# Patient Record
Sex: Male | Born: 1935 | Race: Black or African American | Hispanic: No | Smoking: Former smoker
Health system: Southern US, Community
[De-identification: ages and names within clinical notes are randomized; demographics above are authoritative.]

## PROBLEM LIST (undated history)

## (undated) DIAGNOSIS — H4089 Other specified glaucoma: Secondary | ICD-10-CM

## (undated) DIAGNOSIS — C649 Malignant neoplasm of unspecified kidney, except renal pelvis: Secondary | ICD-10-CM

## (undated) DIAGNOSIS — F329 Major depressive disorder, single episode, unspecified: Secondary | ICD-10-CM

## (undated) DIAGNOSIS — I10 Essential (primary) hypertension: Secondary | ICD-10-CM

## (undated) DIAGNOSIS — Z8546 Personal history of malignant neoplasm of prostate: Secondary | ICD-10-CM

## (undated) DIAGNOSIS — F32A Depression, unspecified: Secondary | ICD-10-CM

## (undated) HISTORY — DX: Depression, unspecified: F32.A

## (undated) HISTORY — DX: Major depressive disorder, single episode, unspecified: F32.9

## (undated) HISTORY — DX: Essential (primary) hypertension: I10

## (undated) HISTORY — DX: Malignant neoplasm of unspecified kidney, except renal pelvis: C64.9

## (undated) HISTORY — DX: Personal history of malignant neoplasm of prostate: Z85.46

## (undated) HISTORY — PX: ELBOW FRACTURE SURGERY: SHX616

## (undated) HISTORY — DX: Other specified glaucoma: H40.89

---

## 2002-09-02 ENCOUNTER — Ambulatory Visit: Admission: RE | Admit: 2002-09-02 | Discharge: 2002-10-23 | Payer: Self-pay | Admitting: Radiation Oncology

## 2002-11-09 ENCOUNTER — Ambulatory Visit: Admission: RE | Admit: 2002-11-09 | Discharge: 2003-02-07 | Payer: Self-pay | Admitting: Radiation Oncology

## 2003-02-22 ENCOUNTER — Ambulatory Visit: Admission: RE | Admit: 2003-02-22 | Discharge: 2003-02-22 | Payer: Self-pay | Admitting: Radiation Oncology

## 2003-04-13 ENCOUNTER — Ambulatory Visit (HOSPITAL_COMMUNITY): Admission: RE | Admit: 2003-04-13 | Discharge: 2003-04-13 | Payer: Self-pay | Admitting: Ophthalmology

## 2003-08-23 ENCOUNTER — Ambulatory Visit: Admission: RE | Admit: 2003-08-23 | Discharge: 2003-08-23 | Payer: Self-pay | Admitting: Radiation Oncology

## 2003-08-31 ENCOUNTER — Ambulatory Visit: Admission: RE | Admit: 2003-08-31 | Discharge: 2003-08-31 | Payer: Self-pay | Admitting: Radiation Oncology

## 2003-12-17 ENCOUNTER — Ambulatory Visit: Payer: Self-pay | Admitting: Psychology

## 2003-12-27 ENCOUNTER — Ambulatory Visit: Payer: Self-pay | Admitting: Internal Medicine

## 2004-01-14 ENCOUNTER — Ambulatory Visit: Payer: Self-pay | Admitting: Psychology

## 2004-08-01 ENCOUNTER — Ambulatory Visit (HOSPITAL_COMMUNITY): Admission: RE | Admit: 2004-08-01 | Discharge: 2004-08-01 | Payer: Self-pay | Admitting: Ophthalmology

## 2004-09-04 ENCOUNTER — Ambulatory Visit: Payer: Self-pay | Admitting: Internal Medicine

## 2004-11-30 ENCOUNTER — Ambulatory Visit: Payer: Self-pay | Admitting: Internal Medicine

## 2005-05-22 ENCOUNTER — Ambulatory Visit: Payer: Self-pay | Admitting: Internal Medicine

## 2005-11-16 ENCOUNTER — Ambulatory Visit: Payer: Self-pay | Admitting: Internal Medicine

## 2006-11-11 ENCOUNTER — Encounter: Payer: Self-pay | Admitting: Internal Medicine

## 2006-11-11 ENCOUNTER — Ambulatory Visit: Payer: Self-pay | Admitting: Internal Medicine

## 2006-11-11 DIAGNOSIS — I10 Essential (primary) hypertension: Secondary | ICD-10-CM | POA: Insufficient documentation

## 2006-11-11 DIAGNOSIS — H4089 Other specified glaucoma: Secondary | ICD-10-CM | POA: Insufficient documentation

## 2006-11-11 DIAGNOSIS — Z8546 Personal history of malignant neoplasm of prostate: Secondary | ICD-10-CM

## 2006-11-11 LAB — CONVERTED CEMR LAB
CO2: 31 meq/L (ref 19–32)
GFR calc Af Amer: 83 mL/min
GFR calc non Af Amer: 68 mL/min
Glucose, Bld: 129 mg/dL — ABNORMAL HIGH (ref 70–99)
Sodium: 142 meq/L (ref 135–145)

## 2007-02-13 HISTORY — PX: NEPHRECTOMY: SHX65

## 2007-12-06 ENCOUNTER — Inpatient Hospital Stay (HOSPITAL_COMMUNITY): Admission: EM | Admit: 2007-12-06 | Discharge: 2007-12-09 | Payer: Self-pay | Admitting: Emergency Medicine

## 2007-12-06 ENCOUNTER — Ambulatory Visit: Payer: Self-pay | Admitting: Internal Medicine

## 2007-12-11 ENCOUNTER — Telehealth: Payer: Self-pay | Admitting: Internal Medicine

## 2007-12-11 ENCOUNTER — Ambulatory Visit: Payer: Self-pay | Admitting: Internal Medicine

## 2007-12-16 ENCOUNTER — Encounter: Payer: Self-pay | Admitting: Internal Medicine

## 2007-12-17 ENCOUNTER — Telehealth: Payer: Self-pay | Admitting: Internal Medicine

## 2007-12-18 ENCOUNTER — Encounter: Payer: Self-pay | Admitting: Internal Medicine

## 2008-01-06 ENCOUNTER — Encounter: Payer: Self-pay | Admitting: Internal Medicine

## 2008-01-12 ENCOUNTER — Inpatient Hospital Stay (HOSPITAL_COMMUNITY): Admission: AD | Admit: 2008-01-12 | Discharge: 2008-01-19 | Payer: Self-pay | Admitting: Urology

## 2008-01-12 ENCOUNTER — Encounter: Payer: Self-pay | Admitting: Urology

## 2008-01-23 ENCOUNTER — Encounter: Payer: Self-pay | Admitting: Internal Medicine

## 2008-12-03 ENCOUNTER — Ambulatory Visit: Payer: Self-pay | Admitting: Internal Medicine

## 2008-12-03 ENCOUNTER — Telehealth: Payer: Self-pay | Admitting: Internal Medicine

## 2008-12-03 DIAGNOSIS — J45909 Unspecified asthma, uncomplicated: Secondary | ICD-10-CM | POA: Insufficient documentation

## 2008-12-03 DIAGNOSIS — C649 Malignant neoplasm of unspecified kidney, except renal pelvis: Secondary | ICD-10-CM | POA: Insufficient documentation

## 2008-12-03 DIAGNOSIS — F329 Major depressive disorder, single episode, unspecified: Secondary | ICD-10-CM

## 2009-03-15 ENCOUNTER — Ambulatory Visit: Payer: Self-pay | Admitting: Internal Medicine

## 2010-01-17 IMAGING — CT CT ABDOMEN W/ CM
2 of 5 series · 16 of 46 positions shown, 18 images · IV contrast (agent unspecified)
Comparison: 125 ml of omni 300

CT ABDOMEN

CLINICAL DATA: Renal cell carcinoma

CT ABDOMEN AND PELVIS WITH CONTRAST
TECHNIQUE: Multidetector CT imaging of the abdomen and pelvis was
performed using the standard protocol following bolus
administration of intravenous contrast.
Contrast: 12/06/2007

[Series 2: abd_pel 5.0 b40f st · axial · 0.61mm/px · z∈[-478,-54]mm · 13 of 95 slices shown, 15 images]
[im 5/95  soft-tissue]
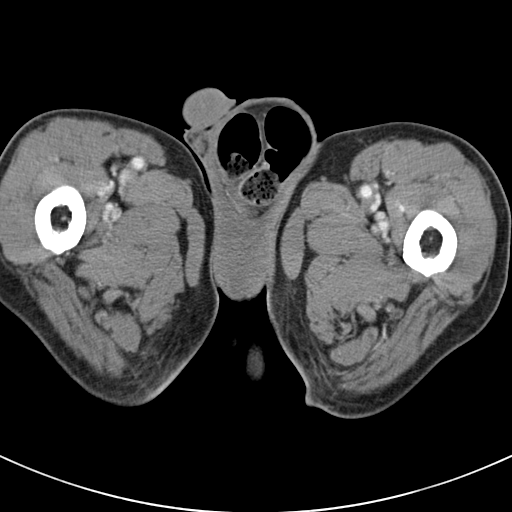
[im 5/95  bone]
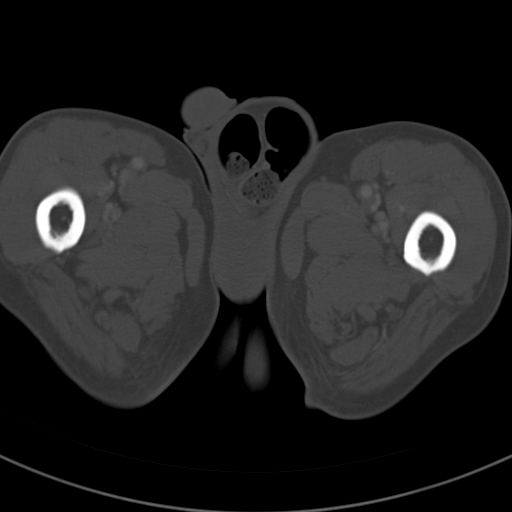
[im 15/95  soft-tissue]
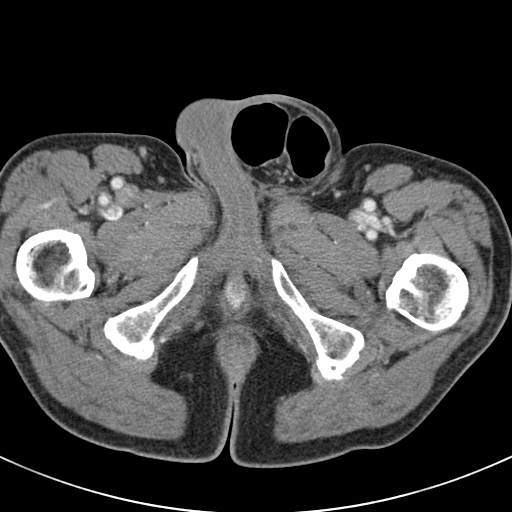
[im 20/95  soft-tissue]
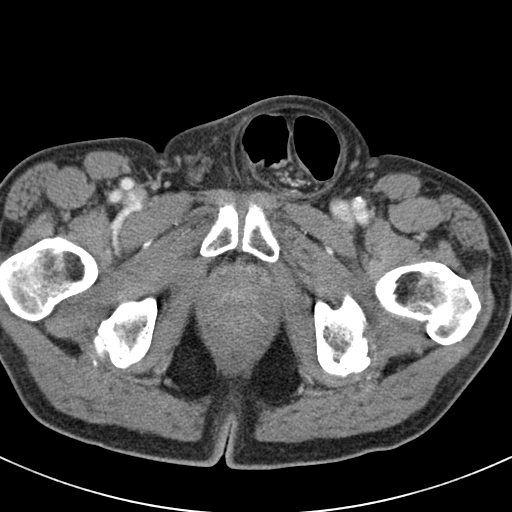
[im 25/95  soft-tissue]
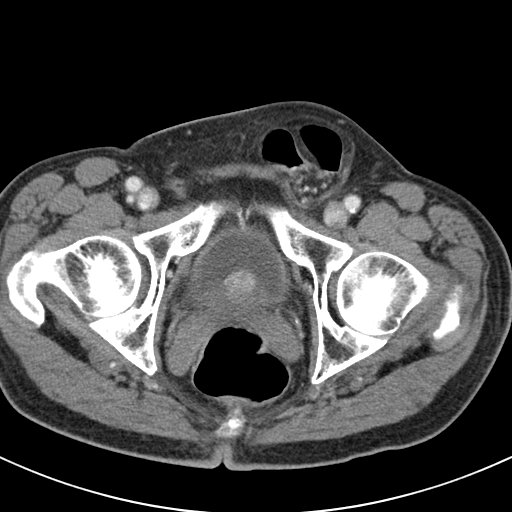
[im 35/95  soft-tissue]
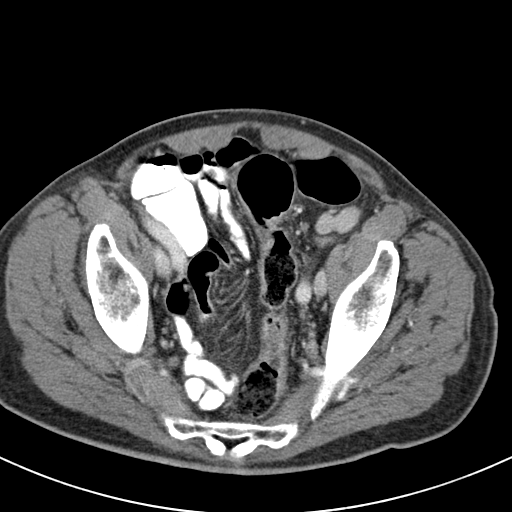
[im 40/95  soft-tissue]
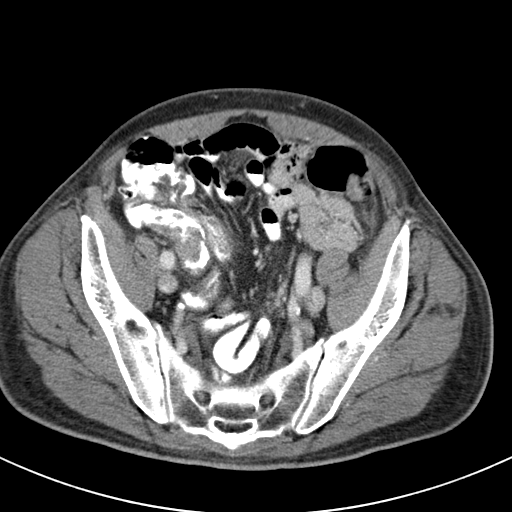
[im 50/95  soft-tissue]
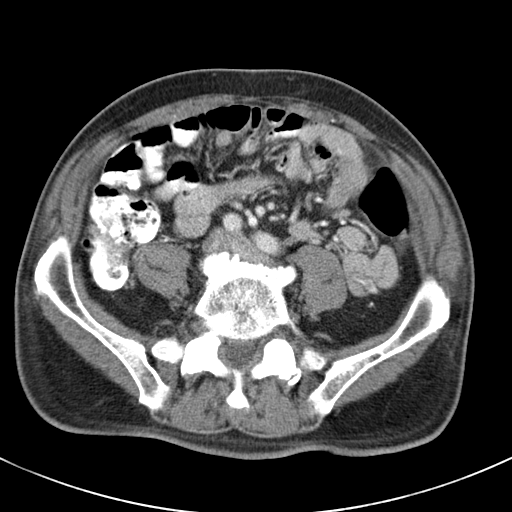
[im 55/95  soft-tissue]
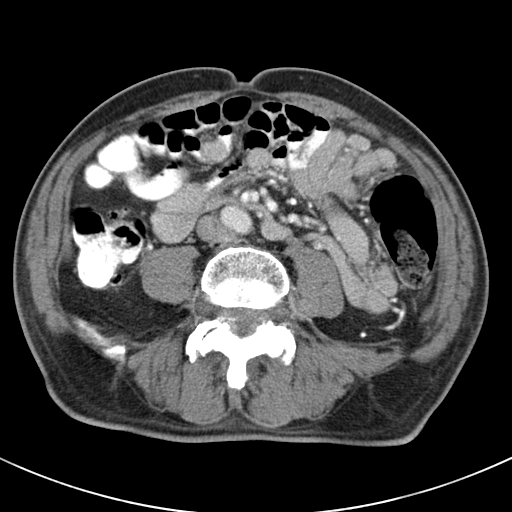
[im 60/95  soft-tissue]
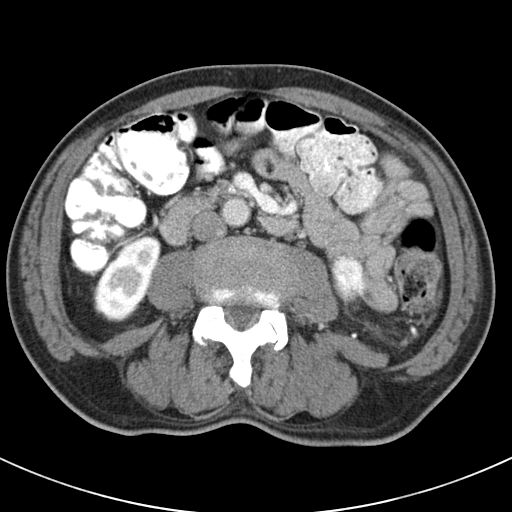
[im 60/95  bone]
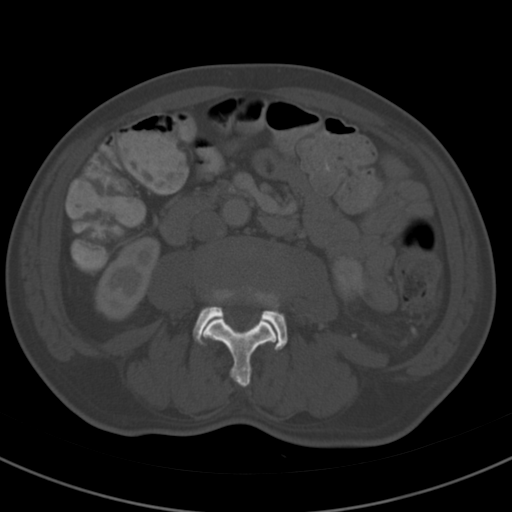
[im 70/95  soft-tissue]
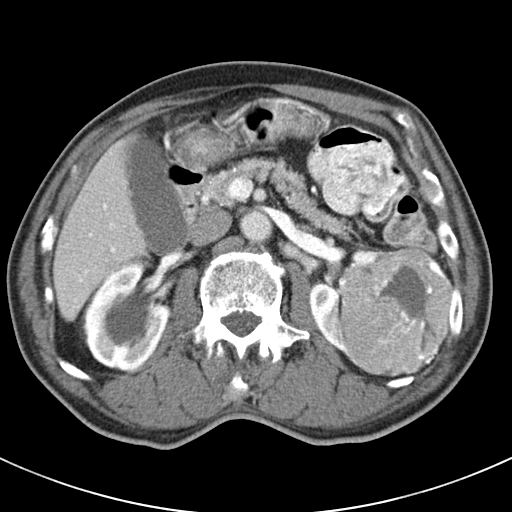
[im 75/95  soft-tissue]
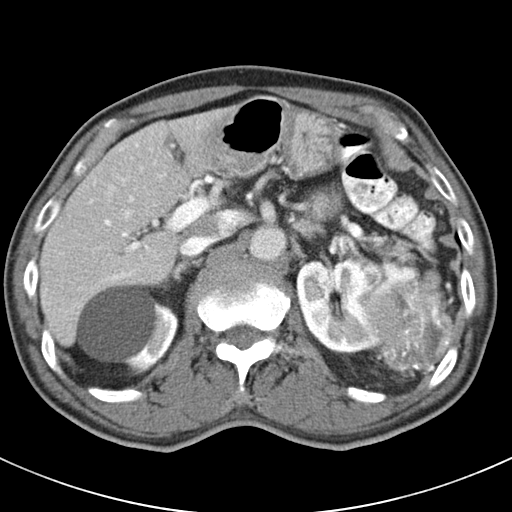
[im 80/95  soft-tissue]
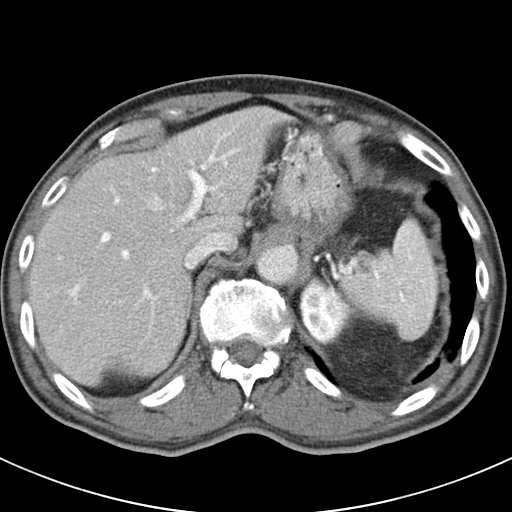
[im 90/95  soft-tissue]
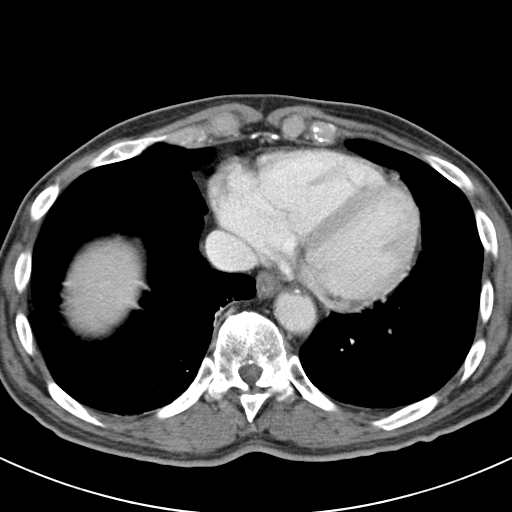

[Series 602: coronal abdomen · coronal · 0.96mm/px · 3 of 120 slices shown]
[im 40/120  soft-tissue]
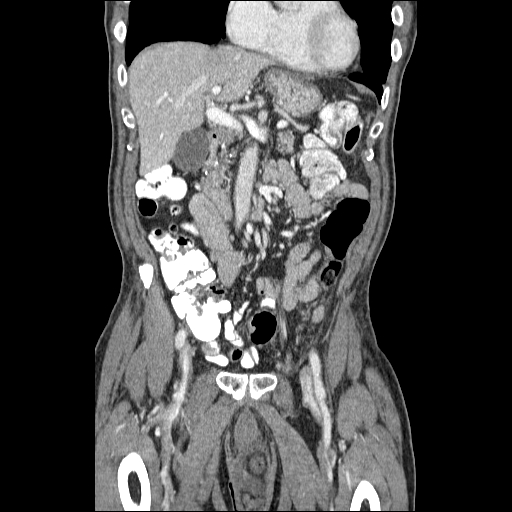
[im 53/120  soft-tissue]
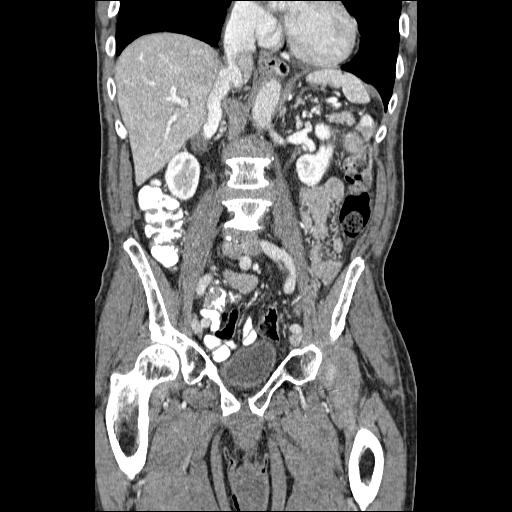
[im 67/120  soft-tissue]
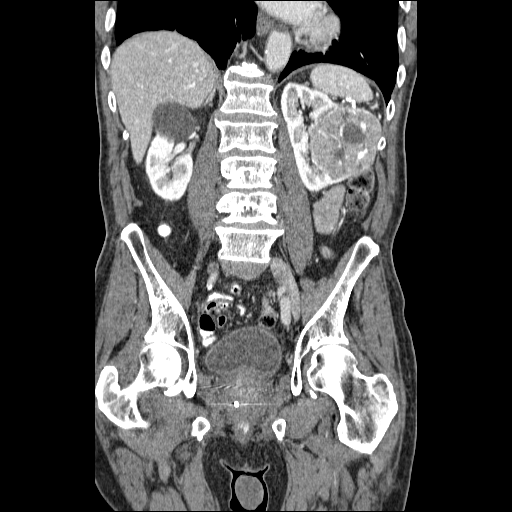

[16 of 46 positions shown; findings below may reference images not displayed]

FINDINGS: The lung bases are clear

There is a small hypodensity in the right hepatic lobe which
measures 8 mm, image 12.  This is too small to characterize the
remaining portions of the liver parenchyma are normal.  No
intrahepatic biliary ductal dilatation.

The gallbladder is normal.

The pancreas is normal.

The adrenal glands are normal.

The spleen is normal.

There is a large simple appearing cyst arising from the upper pole
of the right kidney.

Large enhancing heterogeneous mass arises from the interpolar
region of the left kidney.  This measures 8 x 7.4 cm, image 25.
Finding is consistent with a renal cell carcinoma.

The left renal vein is patent.  The IVC appears patent.

There are no enlarged upper abdominal lymph nodes.

There is no free fluid or abnormal fluid collections.

The bowel loops of the upper abdomen are normal in their course and
caliber without obstruction.

Review of the visualized osseous structures shows lumbar
degenerative disc disease.
IMPRESSION: 1.  Large heterogeneous mass arising from the left kidney is
consistent with a renal cell carcinoma.
2.  Simple appearing cyst is identified within the right kidney.

3.  No specific features to suggest metastatic disease.
4.  Indeterminate hypodensity in the right hepatic lobe is too
small to characterize.

CT PELVIS
FINDINGS: Appendix identified and normal.

No free fluid or abnormal fluid collections.

No significant lymphadenopathy.

Urinary bladder is normal. There is a large left inguinal hernia
which contains nonobstructed loops of large bowel.

The prostate gland is enlarged in exhibits mass effect upon the
base of the bladder.
IMPRESSION: 1.  Large left inguinal hernia contains nonobstructed loops of
large bowel.
2.  Prostate gland enlargement

## 2010-02-08 ENCOUNTER — Encounter: Payer: Self-pay | Admitting: Internal Medicine

## 2010-03-10 ENCOUNTER — Other Ambulatory Visit: Payer: Self-pay | Admitting: Internal Medicine

## 2010-03-10 ENCOUNTER — Ambulatory Visit
Admission: RE | Admit: 2010-03-10 | Discharge: 2010-03-10 | Payer: Self-pay | Source: Home / Self Care | Attending: Internal Medicine | Admitting: Internal Medicine

## 2010-03-10 ENCOUNTER — Encounter: Payer: Self-pay | Admitting: Internal Medicine

## 2010-03-10 DIAGNOSIS — R413 Other amnesia: Secondary | ICD-10-CM | POA: Insufficient documentation

## 2010-03-10 LAB — TSH: TSH: 1.75 u[IU]/mL (ref 0.35–5.50)

## 2010-03-10 LAB — LIPID PANEL
Cholesterol: 232 mg/dL — ABNORMAL HIGH (ref 0–200)
HDL: 78 mg/dL (ref >39.00)
Total CHOL/HDL Ratio: 3
Triglycerides: 65 mg/dL (ref 0.0–149.0)

## 2010-03-10 LAB — BASIC METABOLIC PANEL
CO2: 28 mEq/L (ref 19–32)
Chloride: 107 mEq/L (ref 96–112)
Potassium: 4.4 mEq/L (ref 3.5–5.1)
Sodium: 141 mEq/L (ref 135–145)

## 2010-03-16 NOTE — Assessment & Plan Note (Signed)
Summary: follow up-pt wants to speak with MD before having ears cleane...   Vital Signs:  Patient profile:   77 year old male Height:      69 inches Weight:      148 pounds O2 Sat:      99 % on Room air Temp:     97.3 degrees F oral Pulse rate:   65 / minute BP sitting:   138 / 86  (right arm)  Vitals Entered By: Bill Salinas CMA (March 15, 2009 1:14 PM)  O2 Flow:  Room air CC: pt here to have ears cleaned, but would like to speak with Dr Debby Bud first/ ab   Primary Care Provider:  Darnita Woodrum  CC:  pt here to have ears cleaned and but would like to speak with Dr Debby Bud first/ ab.  History of Present Illness: Patient presents due to decreased hearing and cerumen build up.  He has no other complaints. He does ask if his recent illnesses are related to a change in his perspiration rate. He has done well since having radical nephrectomy.  Current Medications (verified): 1)  Hydrochlorothiazide 25 Mg  Tabs (Hydrochlorothiazide) .... Once Daily 2)  Xalatan 0.005 %  Soln (Latanoprost) .Marland Kitchen.. 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 Retired from [employer]. widowed 1998 lives alone, I-ADL attends adult day care/activity program  Review of Systems       The patient complains of difficulty walking.  The patient denies anorexia, fever, weight loss, hoarseness, chest pain, dyspnea on exertion, abdominal pain, muscle weakness, and enlarged lymph nodes.    Physical Exam  General:  alert and well-hydrated [name] in no  distress.   Head:  normocephalic and atraumatic.   Ears:  cerumen impaction bilaterally Lungs:  normal respiratory effort and normal breath sounds.   Heart:  normal rate and regular rhythm.     Impression & Recommendations:  Problem # 1:  CERUMEN IMPACTION, BILATERAL (ICD-380.4) cerumen impaction.  Plan - irrigation by Ami Bullins, CMA  Addendum - no complications.   Complete Medication List: 1)  Hydrochlorothiazide 25 Mg Tabs (Hydrochlorothiazide) .... Once daily 2)  Xalatan 0.005 % Soln (Latanoprost) .Marland Kitchen.. 1 qtts at bedtime

## 2010-03-16 NOTE — Letter (Signed)
[facility]   Imported By: Lennie Odor 03/28/2009 17:00:49  _____________________________________________________________________  External Attachment:    Type:   Image     Comment:   External Document

## 2010-03-16 NOTE — Letter (Signed)
Summary: [facility]   Imported By: Sherian Rein 02/23/2010 12:58:27  _____________________________________________________________________  External Attachment:    Type:   Image     Comment:   External Document

## 2010-03-22 NOTE — Assessment & Plan Note (Signed)
Summary: needs extended ov last appt 2/11/cd   Vital Signs:  Patient profile:   77 year old male Height:      68 inches Weight:      151 pounds BMI:     23.04 Temp:     97.8 degrees F oral Pulse rate:   66 / minute BP sitting:   142 / 88  (left arm) Cuff size:   regular  Vitals Entered By: Lamar Sprinkles, CMA (March 10, 2010 3:31 PM) CC: Pt is not taking HCTZ - here for f/u/SD   Primary Care Provider:  Domonik Levario  CC:  Pt is not taking HCTZ - here for f/u/SD.  History of Present Illness: [name] is confused about using our phone system. He is concerned about his memory.   He saw Dr. Vonita Moss Dec 28th, '11 and came to CT scan for follow-up of Renal cell carcinoma - scan ws ok.   Independent in ADLs but doesn't cook or drive. He lives in a retirement community. No falls. Pays his own bills. Is concerned about memory loss. Chronic long term depression.   Preventive Screening-Counseling & Management  Alcohol-Tobacco     Alcohol drinks/day: 0     Smoking Status: quit     Year Quit: 1960's  Caffeine-Diet-Exercise     Caffeine use/day: none     Diet Comments: healthy diet     Does Patient Exercise: yes     Type of exercise: walking     Exercise (avg: min/session): 30-60     Times/week: 5  Hep-HIV-STD-Contraception     Hepatitis Risk: no risk noted     HIV Risk: no risk noted     STD Risk: no risk noted     Dental Visit-last 6 months no     Sun Exposure-Excessive: no  Safety-Violence-Falls     Seat Belt Use: n/a     Helmet Use: n/a     Firearms in the Home: no firearms in the home     Smoke Detectors: no     Violence in the Home: not applicable     Sexual Abuse: no      Drug Use:  never.        Blood Transfusions:  no.    Allergies (verified): No Known Drug Allergies  Past History:  Past Medical History: Last updated: 12/03/2008 UCD ASTHMA, MILD (ICD-493.90) DEPRESSION (ICD-311) MALIGNANT NEOPLASM OF KIDNEY EXCEPT PELVIS (ICD-189.0) GLAUCOMA NEC  (ICD-365.89) HX, PERSONAL, MALIGNANCY, PROSTATE (ICD-V10.46) HYPERTENSION, ESSENTIAL NOS (ICD-401.9)  Past Surgical History: Last updated: 12/03/2008 repair of fractured elbow  Nephrectomy-left '09  Family History: Last updated: 11/11/2006 non-contributory  Social History: Last updated: 12/03/2008 retired from [employer]. Retired from [employer]. widowed 1998 lives alone, I-ADL attends adult day care/activity program  Social History: Smoking Status:  quit Caffeine use/day:  none Does Patient Exercise:  yes Dental Care w/in 6 mos.:  no Sun Exposure-Excessive:  no Risk analyst Use:  n/a Drug Use:  never Blood Transfusions:  no Hepatitis Risk:  no risk noted HIV Risk:  no risk noted STD Risk:  no risk noted  Review of Systems  The patient denies fever, weight loss, weight gain, decreased hearing, chest pain, syncope, abdominal pain, muscle weakness, difficulty walking, and abnormal bleeding.    Physical Exam  General:  WNWD developed AA man who looks younger than his stated age Head:  normocephalic and atraumatic.   Eyes:  pupils equal, pupils round, corneas and lenses clear, and no injection.   Neck:  supple.   Lungs:  normal respiratory effort and normal breath sounds.   Heart:  normal rate and regular rhythm.   Neurologic:  MMSE 1. day, date, year - ok 2. Content - pres, gov, cruise ship story,  - fair 3. 5 fwd - 1 error; 5 rev - not, 4 rev - not 4. serial 7's - ok; nickles -ok; change - ok 5. word recall -  6. naming - objects, 4 legged - ok 7. parables - ok  8. Judgement - ok 9. Fair clock  Skin:  turgor normal and color normal.   Psych:  Oriented X3, normally interactive, and good eye contact.     Impression & Recommendations:  Problem # 1:  MALIGNANT NEOPLASM OF KIDNEY EXCEPT PELVIS (ICD-189.0) followed closely by Dr. Vonita Moss. He has had a recent CT abdomen which was negative for any suspicious mass or signs of recurrent hypernephroma  Problem # 2:  DEPRESSION  (ICD-311) He still is very much grieving his wife and feels that his life has never got back on track since her demise. He doesn't have any overy, vegative signs of depression and does remain outwardly engaged.  Problem # 3:  MEMORY LOSS (ICD-780.93) He performed well on the MMSE except for short-term recall of words. He does not seem to be at the point where any medication will be particulary helpful. He should have reassessment in six months.  Problem # 4:  HYPERTENSION, ESSENTIAL NOS (ICD-401.9)  His updated medication list for this problem includes:    Hydrochlorothiazide 25 Mg Tabs (Hydrochlorothiazide) ..... Once daily  Orders: TLB-BMP (Basic Metabolic Panel-BMET) (80048-METABOL)  BP today: 142/88 Prior BP: 138/86 (03/15/2009)  BP is borderline controlled. Lab work reveals normal renal function and normal electrolytes.  Plan - continue present medication           return for folow up and consideration of modifying medication.     Problem # 5:  Preventive Health Care (ICD-V70.0) Interval history is stable. His limited exam is unremarkable. Lab results are generally fine except for his lipid panel. The HDL is very high and protective; the LDL is above goal of 130 or less but less than the treatment threshold (NCEP ATPIII) of 160. Given his age and overall health the only treatment indicated is good life-style with a low fat diet. Prostate health per Dr. Vonita Moss. Last colonoscopy was June '01. At 86 no additional colorectal cancer screening is needed. Immunizations: he is in need of pneumonia vaccine, singles vaccine and tetnus booster.  In summary - a very nice man who appears medically stable. He should return in 2-3 months for follow-up of his blood pressure. He is advised to watch his diet and to check his BP and bring readings in to his next visit.   Orders: Medicare -1st Annual Wellness Visit [id number]) TLB-Lipid Panel (80061-LIPID) TLB-TSH (Thyroid Stimulating Hormone)  (84443-TSH)  Complete Medication List: 1)  Hydrochlorothiazide 25 Mg Tabs (Hydrochlorothiazide) .... Once daily 2)  Xalatan 0.005 % Soln (Latanoprost) .Marland Kitchen.. 1 qtts at bedtime   Patient: Steve Boone Note: All result statuses are Final unless otherwise noted.  Tests: (1) BMP (METABOL)   Sodium                    141 mEq/L                   135-145   Potassium  4.4 mEq/L                   3.5-5.1   Chloride                  107 mEq/L                   96-112   Carbon Dioxide            28 mEq/L                    19-32   Glucose                   96 mg/dL                    01-02   BUN                       21 mg/dL                    7-25   Creatinine                1.4 mg/dL                   3.6-6.4   Calcium                   9.4 mg/dL                   4.0-34.7   GFR                       62.54 mL/min                >60.00  Tests: (2) Lipid Panel (LIPID)   Cholesterol          [H]  232 mg/dL                   4-259     ATP III Classification            Desirable:  < 200 mg/dL                    Borderline High:  200 - 239 mg/dL               High:  > = 240 mg/dL   Triglycerides             65.0 mg/dL                  5.6-387.5     Normal:  <150 mg/dL     Borderline High:  643 - 199 mg/dL   HDL                       32.95 mg/dL                 >18.84   VLDL Cholesterol          13.0 mg/dL                  1.6-60.6  CHO/HDL Ratio:  CHD Risk                             3  Men          Women     1/2 Average Risk     3.4          3.3     Average Risk          5.0          4.4     2X Average Risk          9.6          7.1     3X Average Risk          15.0          11.0                           Tests: (3) TSH (TSH)   FastTSH                   1.75 uIU/mL                 0.35-5.50  Tests: (4) Cholesterol LDL - Direct (DIRLDL)  Cholesterol LDL - Direct                             148.9 mg/dL     Optimal:  <098 mg/dL     Near or Above  Optimal:  100-129 mg/dL     Borderline High:  119-147 mg/dL     High:  829-562 mg/dL     Very High:  >130 mg/dL  Orders Added: 1)  Medicare -1st Annual Wellness Visit [G0438] 2)  Est. Patient Level III [86578] 3)  TLB-BMP (Basic Metabolic Panel-BMET) [80048-METABOL] 4)  TLB-Lipid Panel [80061-LIPID] 5)  TLB-TSH (Thyroid Stimulating Hormone) [46962-XBM]

## 2010-03-22 NOTE — Letter (Signed)
Summary: [facility]  [facility]   Imported By: Sherian Rein 03/15/2010 12:56:03  _____________________________________________________________________  External Attachment:    Type:   Image     Comment:   External Document

## 2010-06-27 NOTE — H&P (Signed)
Steve Boone, Steve Boone             ACCOUNT NO.:  [id number]   MEDICAL RECORD NO.:  [id number]          PATIENT TYPE:  EMS   LOCATION:  ED                           FACILITY:  [facility]   PHYSICIAN:  Sean A. Everardo All, MD    DATE OF BIRTH:  [birth year]   DATE OF ADMISSION:  12/06/2007  DATE OF DISCHARGE:                              HISTORY & PHYSICAL   REASON FOR ADMISSION:  Left renal mass.   HISTORY OF PRESENT ILLNESS:  An 77 year old man with about 6 hours of  moderate left flank pain.  He has some associated nausea.  He says he  has had swelling at the left inguinal area for about 3 months.   PAST MEDICAL HISTORY:  1. Hypertension for which he has not recently followed up in our      office.  2. Glaucoma.  3. Prostate cancer for which he has had seed implants.   MEDICATIONS:  1. Two types of glaucoma drops.  2. Hydrochlorothiazide, which he is not recently taking.   SOCIAL HISTORY:  He is retired.  He is a widow.   FAMILY HISTORY:  Negative for renal disease.   REVIEW OF SYSTEMS:  Denies the following; arthralgias, headache, loss of  consciousness, fever, visual loss, weight loss, shortness of breath,  chest pain, rectal bleeding, hematuria, and skin rash.   PHYSICAL EXAMINATION:  VITAL SIGNS:  Blood pressure 160/75, heart rate  68, respiratory rate 20, temperature is 97.4.  GENERAL:  No distress.  SKIN:  Not diaphoretic.  No rash.  HEENT:  No proptosis.  No periorbital swelling.  NECK:  Supple.  CHEST:  Clear to auscultation.  No respiratory distress.  CARDIOVASCULAR:  No JVD.  No edema.  Regular rate and rhythm.  No  murmur.  Pedal pulses are intact.  ABDOMEN:  Soft, nontender.  No hepatosplenomegaly.  No mass.  He does  have slight fullness in the left flank, but I really cannot tell this  for sure.  GENITAL:  A large left inguinal hernia is present.  It is not reducible.  EXTREMITIES:  Mild osteoarthritic changes are noted.  NEUROLOGIC:  He is alert, well oriented.   He is oriented x3 except he  gives yesterday's date when asked.  He readily moves all fours.  His  cranial nerves appear to be intact.   LABORATORY STUDIES:  Remarkable for glucose 150, hemoglobin 12.6, and  hematuria.  CT scan shows left renal mass.   IMPRESSION:  1. Left renal mass.  2. Left inguinal hernia.  3. Anemia.  4. Hypertension.  5. Hyperglycemia.   PLAN:  1. Consult Urology.  2. Check other labs.  Check anemia studies.  3. Resume hydrochlorothiazide.  4. He may take his glaucoma drops from home.  5. I discussed code status with the patient.  He states he wants to be      full code, but we do want to be started or maintained on artificial      life support measures if there is not a reasonable chance of a      functional recovery.  Sean A. Everardo All, MD  Electronically Signed     SAE/MEDQ  D:  12/06/2007  T:  12/06/2007  Job:  [id number]

## 2010-06-27 NOTE — Op Note (Signed)
Steve Boone, Steve Boone             ACCOUNT NO.:  [id number]   MEDICAL RECORD NO.:  [id number]          PATIENT TYPE:  AMB   LOCATION:  DAY                          FACILITY:  [facility]   PHYSICIAN:  Bertram Millard. Dahlstedt, M.D.DATE OF BIRTH:  26-Jun-1923   DATE OF PROCEDURE:  01/12/2008  DATE OF DISCHARGE:                               OPERATIVE REPORT   ASSISTANT:  Dr. Christianne Borrow   PREOPERATIVE DIAGNOSIS:  Left renal mass.   POSTOPERATIVE DIAGNOSIS:  Left renal mass.   INDICATIONS:  This is an 77 year old gentleman with a left-sided 8-cm  renal mass.  Discussion and possible treatment options were had with the  patient.  The patient elected to proceed with nephrectomy.  It was  chosen to be an open, based on specific risks and benefits of bleeding,  given his status of Jehovah's Witness.   PROCEDURE:  Left open radical nephrectomy.   PROCEDURE IN DETAIL:  Patient was brought back to the operating room.  After the successful induction of a general endotracheal anesthetic, he  was placed in a supine position.  All pressure points were padded  appropriately.  He received preoperative antibiotics, and a preoperative  time-out was performed.   A left subcostal incision was made in the xiphoid process out laterally  towards the tip of the 11th rib.  We dissected through the subcutaneous  tissue and then identified the rectus muscle in the midline.  Getting  underneath the midline below the rectus muscle, we were able to aid with  our dissection down through the peritoneum.  We dissected through  rectus, external and internal oblique, transversalis, and ultimately the  peritoneum.  Once adequate exposure had been made, we placed the  Bookwalter retractor.  We then incised the white line of Toldt,  mobilizing the colon medially and identified the kidney and the mass.  With further dissection, we were able to identify the pedicle of the  kidney.  The vein was dissected initially, and a  holding tie was placed  on it.  With lateral retraction on the vein, we were able to identify  the renal artery, which was clipped.  We then tied and cut the renal  vein and continued by clipping the renal artery in 2 more locations,  dividing it.  We then came across the inferior pole of the kidney,  identifying and taking the ureter with clips.  We then freed up the  kidney laterally and superiorly.  At this point, the kidney was freed.  The entire specimen was removed and sent to pathology for frozen view.   At this point, we visualized the nephrectomy bed and after irrigation,  there was no visible bleeding seen.  Two pieces of Surgicel were placed  on the wound.  We proceeded with closure.  The wound was closed with two  1-0 PDS in a running fashion, the first incorporating the rectus and  posterior fascia.  The next layer was done over the anterior fascia.  At  this point, we closed the skin with a stapler.  The procedure was ended.   Please note that Dr. Jeannett Senior  Dahlstedt was the responsible surgeon and  was present throughout the entirety of the case.   ESTIMATED BLOOD LOSS:  300 mL   URINE OUTPUT:  300 ml through a Foley catheter.   SPECIMENS:  The left kidney.   DISPOSITION:  Patient will go to the PACU for further care.      Delman Kitten, MD      Bertram Millard. Dahlstedt, M.D.  Electronically Signed    DW/MEDQ  D:  01/12/2008  T:  01/12/2008  Job:  [id number]

## 2010-06-27 NOTE — Discharge Summary (Signed)
NAMECANNON, Steve Boone             ACCOUNT NO.:  [id number]   MEDICAL RECORD NO.:  [id number]          PATIENT TYPE:  INP   LOCATION:  1523                         FACILITY:  [facility]   PHYSICIAN:  Rosalyn Gess. Norins, MD  DATE OF BIRTH:  [birth year]   DATE OF ADMISSION:  12/06/2007  DATE OF DISCHARGE:  12/09/2007                               DISCHARGE SUMMARY   ADMITTING DIAGNOSIS:  Acute left flank pain.   DISCHARGE DIAGNOSIS:  Renal cell carcinoma of the left kidney.   CONSULTANTS:  Dr. Larey Dresser, urology.   PROCEDURES:  1. Plain x-ray of the abdomen December 06, 2007 which showed a moderate      amount of stool throughout the colon without evidence of      obstruction or free intraperitoneal air.  2. CT scan of the abdomen and pelvis performed December 06, 2007 which      revealed large interpolar left renal mass most consistent with      renal cell carcinoma with suspicion for direct invasion of adjacent      tissue based on perinephric stranding.  Chronic calcific      pancreatitis.  Right upper pole renal cystic lesion incompletely      evaluated.  Pelvis with a large left inguinal hernia containing      loop of sigmoid colon.  Mild thickening of the sigmoid distally.      Prostate brachytherapy seeds in place.  3. Two view of the chest December 06, 2007 no active disease.  4. Repeat CT abdomen and pelvis with no additional findings as from      previous study.  5. Whole body bone scan performed December 08, 2007 which showed no      evidence of skeletal metastasis.  Did show diminished function of      the left kidney.   HISTORY OF PRESENT ILLNESS:  Steve Boone is an 77 year old African  American gentleman followed by Larey Dresser and Dr. Debby Bud for primary  care.  He has had a history of prostate carcinoma treated with radiation  therapy in 2004.   The patient presented on the day of admission to the emergency  department reporting onset quite suddenly at about 5  o'clock in the  morning of moderate to severe left flank pain with associated nausea.  Incidentally he reported swelling in the left inguinal area for the  previous 3 months.  Because of the significant abdominal pain and  discomfort, he was admitted to hospital.  Please see the H and P for  past medical history, family history, social history.  Physical exam at  admission was significant for blood pressure 160/75.  Cardiovascular  exam was unremarkable.  Abdomen was soft, nontender.  There was no  hepatosplenomegaly.  No palpable mass per the admitting doctor.  Although the patient complained of fullness in the left flank, there was  no palpable mass.  There was a large left inguinal hernia present.   HOSPITAL COURSE:  The patient was admitted to a regular medical bed.  Radiographic studies as noted revealed the patient to have what appeared  to  be a large left renal mass consistent with renal cell carcinoma.  He  was seen in consultation by the urology service.  It was felt that pain  was caused by acute hemorrhage into this tumor.   The patient had a negative evaluation for metastatic disease.  Urology  continued to see the patient in consultation.  Their plan was to have  the patient discharged to home and return to see Dr. Larey Dresser as  an outpatient for scheduling of radical left nephrectomy.   With the patient's diagnosis being determined, with his pain being  resolved with no other medical complications, he is felt to be stable  and ready for discharge.   DISCHARGE EXAMINATION:  GENERAL APPEARANCE:  The patient is awake,  alert, sitting up in a chair in his room in no acute distress.  VITAL SIGNS:  Temperature 98.2, blood pressure 112/63, heart rate was  60, respirations were 18, O2 sats 97% on room air.  HEENT: Exam was unremarkable.  CHEST:  The patient is moving air well.  CARDIOVASCULAR:  The patient had a quiet precordium, had a regular rate  and rhythm without  murmurs.  ABDOMEN:  The patient had positive bowel sounds in the sitting position.  I did not appreciate a large mass on palpation, but he was tender in the  left quadrant of his abdomen.  No further examination conducted.   DISPOSITION:  The patient is discharged home.  He will continue on his  home medications including:  1. Xalatan eye drops 0.005% solution.  2. Azopt eye drops 1% suspension.  3. He will continue on hydrochlorothiazide 25 mg daily.  4. He will be given a prescription for Lortab 5/325 to take every 6      hours as needed for pain.  5. He should take a laxative of choice.   The patient will see Dr. Larey Dresser as an outpatient.  He is  instructed to call [facility] for any problems that arise in  the interval.  I have asked to notify me when he is admitted to hospital  for his surgery.   The patient's condition at time of discharge dictation is stable and  improved.      Rosalyn Gess Norins, MD  Electronically Signed     MEN/MEDQ  D:  12/09/2007  T:  12/09/2007  Job:  [id number]   cc:   Maretta Bees. Vonita Moss, M.D.  Fax: [phone]

## 2010-06-27 NOTE — Consult Note (Signed)
NAMESAHAND, GOSCH             ACCOUNT NO.:  [id number]   MEDICAL RECORD NO.:  [id number]          PATIENT TYPE:  INP   LOCATION:  1523                         FACILITY:  [facility]   PHYSICIAN:  Excell Seltzer. Annabell Howells, M.D.    DATE OF BIRTH:  08-31-1923   DATE OF CONSULTATION:  DATE OF DISCHARGE:                                 CONSULTATION   CHIEF COMPLAINT:  Left flank pain.   HISTORY:  Mr. Day is an 77 year old African American male who has  been followed by Dr. Vonita Moss.  He had prostate carcinoma treated with  radiation therapy in 2004.  He was last seen in our office in August, at  which point his PSA was 0.5.  He had the onset this morning of severe  left flank pain and was brought to the emergency room by ambulance.  A  CT scan was performed which demonstrates a 10 cm renal mass with some  calcification and probable internal bleeding that is suspicious for  renal cell carcinoma, question whether there is hilar adenopathy.  He  also has a large left inguinal hernia but his pain appears to be related  to the mass.  He has no gross hematuria but has had marked microscopic  hematuria.  He is currently voiding without complaints.  He does report  he has had a little bit of pain with eating for the last several months  but he is not real clear on questioning he has gotten relief from pain  medication.   PAST HISTORY:  1. Prostate cancer as noted above.  2. Anxiety.  3. Depression.  4. Reflux.  5. Glaucoma.  6. Hypertension.  7. Memory loss.   SURGICAL HISTORY:  1. Prostate biopsies.  2. Radiation therapy for the prostate.  3. Eye surgery.   CURRENT MEDICATIONS:  Azopt eye drops.   HE HAS NO MEDICATION ALLERGIES.   FAMILY HISTORY:  Unremarkable.   SOCIAL HISTORY:  Pertinent for prior smoking but negative for alcohol.   REVIEW OF SYSTEMS:  He has the pain which has abated in the left flank.  He reports a bulge in the left groin which is consistent with his hernia  noted  on exam.  He has had memory loss since his wife died but no other  focal neurologic complaints.  He denies headaches.  He denies any  obvious adenopathy.  He has had no shortness of breath or cough.  He has  had postprandial pain but no diarrhea or constipation.  He denies gross  hematuria and has voided without complaints.  He denies any skin issues.  He is otherwise entirely without complaints for a full review of  systems.   EXAM:  His blood pressure is 160/75, heart rate 68, respirations 20,  temperature 97.4.  GENERAL:  He is a well-developed, pleasant, elderly black male in no  acute distress, alert and oriented x3.  HEAD AND FACE:  Normocephalic/atraumatic.  NECK:  Supple without mass.  He has no cervical, axillary or inguinal  adenopathy.  LUNGS:  Clear with normal effort.  HEART:  Regular rate and rhythm.  ABDOMEN:  Soft, flat with left flank tenderness.  No masses palpable,  but he did guard on exam on the left, no hepatosplenomegaly is noted.  He does have a large left inguinal hernia.  GU/RECTAL:  Not performed.  EXTREMITIES:  Have full range of motion without edema.  SKIN:  Warm and dry.   His urine today has too numerous to count red cells with few bacteria  and is nitrite negative.  Chemistries are remarkable for a creatinine of  1.3 calcium of 1.23 which is normal.  Liver function tests are normal  and alkaline phosphatase is 66, hemoglobin is 12.2 with a hematocrit of  36, white count is 5.5.   I have reviewed his CT films and report and he does have the large  interpolar left renal mass most consistent with renal cell carcinoma.  There is concern about the direct invasion of adjacent tissues because  of perinephric stranding.  He has a right upper pole renal cyst, he has  some chronic calcifications in the prostate, he has evidence of seeds in  the prostate from his radiation, he has the hernia noted at the left  inguinal area and lumbar spondylosis.  No obvious  evidence of bony  metastatic disease is noted.   IMPRESSION:  1. Acute left flank pain with a large left renal mass.  He likely bled      into the mass and that caused the pain.  2. History of prostate cancer, a stable PSA postradiation.  3. Anemia.   RECOMMENDATIONS:  1. Monitor H&H.  2. Analgesia as needed.  3. Bone scan and chest x-ray to complete metastatic workup for renal      tumor.  May need to consider a contrast enhanced CT.  He will      likely need to be considered for a left radical nephrectomy.      Excell Seltzer. Annabell Howells, M.D.  Electronically Signed     JJW/MEDQ  D:  12/06/2007  T:  12/06/2007  Job:  [id number]   cc:   Rosalyn Gess. Norins, MD  [address]  [place]  [place] [address]   Maretta Bees. Vonita Moss, M.D.  Fax: [phone]

## 2010-06-30 NOTE — H&P (Signed)
Steve Boone, Steve Boone NO.:  [id number]   MEDICAL RECORD NO.:  [id number]                   PATIENT TYPE:  OIB   LOCATION:  2899                                 FACILITY:  [facility]   PHYSICIAN:  Guadelupe Sabin, M.D.             DATE OF BIRTH:  1923/08/11   DATE OF ADMISSION:  04/13/2003  DATE OF DISCHARGE:                                HISTORY & PHYSICAL   CHIEF COMPLAINT:  This was a planned outpatient surgical admission of this  77 year old black male, admitted for cataract implant surgery of the right  mature cataract.   HISTORY OF PRESENT ILLNESS:  This patient has been followed in my office  since May 06, 2001.  At that time previously he had been told that he had  an astigmatism in both eyes by Dr. Lucina Mellow, a [facility] optometrist.  An  initial examination in my office revealed a visual acuity of 20/400, right  eye and 20/80 left eye without correction, and 20/40 right eye and 20/20  left eye with refraction.  A slit lamp examination showed a clear cornea,  deep and clear anterior chamber, and early nuclear cataract formation.  Applanation tonometry was elevated at 17 mm right eye and 26 mm left eye.  A  detailed fundus examination with gonioscopy showed the vitreous to be clear  behind the cataract haze.  The optic nerves were sharply outlined, of good  color, with a disc to cup ratio of 0.4 right eye and 0.5 left eye.  It was  felt that the patient had probable elevated intraocular pressure and  glaucoma.  Visual Southard were obtained showing a considerable defect in the  left eye with a small 30-degree central field and an altitudinal superior  scotoma, consistent with glaucoma.  The patient was placed on Xalatan  ophthalmic solution, and intraocular pressures were reduced.  It was,  however, elected to add Timoptic ophthalmic solution to drive the pressure  lower in the left eye.  This was accomplished with pressure of approximately  12 to 15  in each eye.  The patient recently had ocular irritation and  spontaneously discontinued his eye drops.  When seen back in the office  after a several-month absence, his vision had deteriorated to finger  counting right eye, 20/60- left eye, and a mature white cataract was present  in the right eye, with some intumescence.  It was felt that the patient  warranted cataract surgery of the right eye now and left eye later.  He was  given an oral discussion and printed information concerning the procedure  and its possible complications.  He signed an informed consent, and  arrangements were made for his outpatient admission at this time.   PAST MEDICAL HISTORY:  The patient is under the care of Dr. Maretta Bees.  Vonita Moss and Dr. Wynn Banker for a prostate cancer.  He has received  irradiation for this.  Dr. Rosalyn Gess. Norins, his regular physician feels  that the patient is in good general health, and no increased risk for the  proposed surgery.   CURRENT MEDICATIONS:  1. Lexapro.  2. Hydrochlorothiazide.   REVIEW OF SYSTEMS:  No cardiorespiratory complaints.   PHYSICAL EXAMINATION:  VITAL SIGNS:  As recorded on admission, blood  pressure 121/74, pulse 73, respirations 18, temperature 98.0 degrees.  GENERAL:  The patient is a pleasant well-nourished, well-developed black 40-  year-old male, in no acute distress.  HEENT:  Eyes:  Visual acuity as noted above.  Slit lamp examination:  Mature  white intumescent cataract, right eye and nuclear cataract, left eye.  Fundus examination of the right eye:  No view due to dense mature white  cataract.  A B-scan ultrasonography shows the vitreous to be clear, the  retina attached.  CHEST:  Lungs clear to percussion and auscultation.  HEART:  A normal sinus rhythm.  No cardiomegaly.  No murmurs.  ABDOMEN:  Negative.  EXTREMITIES:  Negative.   ADMISSION DIAGNOSES:  1. Mature nuclear cataract, right eye.  2. Immature cataract, left eye.  3.  Chronic open angle glaucoma, both eyes.   SURGICAL PLAN:  Cataract implant surgery, right eye now and left eye later.                                                Guadelupe Sabin, M.D.    HNJ/MEDQ  D:  04/13/2003  T:  04/13/2003  Job:  [id number]   cc:   Rosalyn Gess. Norins, M.D. [facility]

## 2010-06-30 NOTE — H&P (Signed)
NAMESMITTY, ACKERLEY NO.:  [id number]   MEDICAL RECORD NO.:  [id number]          PATIENT TYPE:  OIB   LOCATION:  2872                         FACILITY:  [facility]   PHYSICIAN:  Guadelupe Sabin, M.D.DATE OF BIRTH:  10-May-1923   DATE OF ADMISSION:  08/01/2004  DATE OF DISCHARGE:  08/01/2004                                HISTORY & PHYSICAL   OUTPATIENT SURGERY:  08/01/2004.   REASON FOR ADMISSION:  This was a planned outpatient readmission of this 77-  year-old black male admitted for cataract implant surgery of the left eye.   PRESENT ILLNESS:  This patient has a long history of gradual deterioration  in vision due to progressive cataract formation and also chronic open angle  glaucoma. He was previously admitted on 04/13/2003 for uncomplicated  cataract implant surgery of the right eye. The patient did well following  this procedure with improvement of vision to 20/30 plus. Meanwhile, the  unoperated left eye has deteriorated to 20/70 minus and the patient has  noted smoky, blurred, and dim vision, difficulty with driving, seeing road  signs, difficulty with seeing television, reading, and difficulty with night  vision, and glare vision in bright sunlight. Colors look dim and dull in the  left eye. He signed an informed consent and arrangements were made for his  outpatient readmission.   PAST MEDICAL HISTORY:  The patient continues under the care of his regular  physician, Dr. Debby Bud. The patient currently is taking a hydrochlorothiazide  tablet under Dr. Debby Bud' care. He is felt to be in satisfactory condition  for the proposed surgery under local anesthesia.   REVIEW OF SYSTEMS:  No cardiorespiratory complaints.   PHYSICAL EXAMINATION:  VITAL SIGNS: As recorded on admission, blood pressure  133/72, pulse 73, respirations 16, temperature 98.3. GENERAL APPEARANCE: The  patient is a pleasant, well-nourished, well-developed, 77 year old black  male in no acute  distress.  HEENT: Visual acuity as noted above. Applanation tonometry on Xalatan  ophthalmic solution 20 mm each eye. Slit lamp examination: The eyes are  white and clear with a clear cornea, deep and clear anterior chamber. A  posterior chamber implant is clear and well-centered in the right eye, and  nuclear cataract formation is present in the left eye. Dilated detailed  fundus examination shows a clear vitreous, attached retina. The optic nerve  is sharply outlined with glaucomatous cupping of 0.7. Visual Breithaupt on  12/03/2003 reveal a normal right eye and severe constriction in the left  eye.  CHEST/LUNGS: Clear to percussion and auscultation.  HEART: Normal sinus rhythm, no cardiomegaly. No murmurs.  ABDOMEN: Negative.  EXTREMITIES: Negative.   ADMISSION DIAGNOSIS:  Senile cataract left eye, pseudophakia right eye,  chronic open angle glaucoma both eyes.   SURGICAL PLAN:  Cataract implant surgery, left eye.       HNJ/MEDQ  D:  08/02/2004  T:  08/02/2004  Job:  [id number]   cc:   Rosalyn Gess. Norins, M.D. [facility]

## 2010-06-30 NOTE — Op Note (Signed)
Steve Boone, Steve Boone NO.:  [id number]   MEDICAL RECORD NO.:  [id number]          PATIENT TYPE:  OIB   LOCATION:  2872                         FACILITY:  [facility]   PHYSICIAN:  Guadelupe Sabin, M.D.DATE OF BIRTH:  1923/04/17   DATE OF PROCEDURE:  08/02/2004  DATE OF DISCHARGE:  08/01/2004                                 OPERATIVE REPORT   PREOPERATIVE DIAGNOSIS:  Senile cataract, nuclear cataract, left eye;  chronic open angle glaucoma, left eye.   POSTOPERATIVE DIAGNOSIS:  Senile cataract, nuclear cataract, left eye;  chronic open angle glaucoma, left eye.   DATE OF OPERATION:  Planned extracapsular cataract extraction -  phacoemulsification, primary insertion of posterior chamber intraocular lens  implant.   SURGEON:  Dr. Cecilie Kicks.   ASSISTANT:  Nurse.   ANESTHESIA:  Local 4% Xylocaine, 0.75 Marcaine retrobulbar block with Wydase  added, topical tetracaine, intraocular Xylocaine, anesthesia standby  required in this elderly patient.   OPERATIVE PROCEDURE:  After the patient was prepped and draped, a lid  speculum was inserted in the left eye. The eye was turned downward, and a  superior rectus traction suture placed. Schiotz tonometry was recorded at 5  to 7 scale units with a 5.5 g weight. A peritomy was performed adjacent to  the limbus from the 11 to 1 o'clock position. The corneoscleral junction was  cleaned and a corneoscleral groove made with a 45 degree Superblade. The  anterior chamber was then entered with the 2.5 mm diamond keratome at the 12  o'clock position and a 15 degree blade at the 2:30 position. Using a bent 26  gauge needle on an Ocucoat syringe, a circular capsular rhexis was begun and  then completed with the Utrata forceps. Hydrodissection and hydrodelineation  were performed using 1% Xylocaine. The 30 degree phacoemulsification tip was  then inserted with slow controlled emulsification of the lens nucleus with  back cracking and  fragmentation with the Bechert pick, total ultrasonic time  1 minute 17 seconds, average power level 12%, total amount of fluid used 90  cc. Following removal of the nucleus, the residual cortex was aspirated with  the silicone tipped irrigation-aspiration probe. The posterior capsule  appeared intact with a brilliant red fundus reflex. It was therefore elected  to insert an Allergan medical optics SI 40 NB silicone three-piece posterior  chamber implant, diopter strength +17.50. This was inserted with the  McDonald forceps into the anterior chamber and then centered into the  capsular bag using the [facility] lens rotator. The lens appeared to be well-  centered. The Provisc and Ocucoat  which had been used intermittently during  the procedure were aspirated and replaced with balanced salt solution and  Miochol ophthalmic solution. The operative incisions were self-sealing. It  was, however elected to place a single 10-0 interrupted nylon suture across  the 12 o'clock incision to ensure closure and to prevent  endophthalmitis. Maxitrol and pilocarpine ophthalmic ointments were  instilled in the conjunctival cul-de-sac and a light patch and protector  shield applied. Duration of procedure and anesthesia administration 45  minutes. The patient tolerated the procedure well  in general, left the  operating room for the recovery room in good condition.       HNJ/MEDQ  D:  08/02/2004  T:  08/02/2004  Job:  [id number]

## 2010-06-30 NOTE — Op Note (Signed)
NAMEKAMILO, OCH                         ACCOUNT NO.:  [id number]   MEDICAL RECORD NO.:  [id number]                   PATIENT TYPE:  OIB   LOCATION:  2899                                 FACILITY:  [facility]   PHYSICIAN:  Guadelupe Sabin, M.D.             DATE OF BIRTH:  April 30, 1923   DATE OF PROCEDURE:  04/13/2003  DATE OF DISCHARGE:  04/13/2003                                 OPERATIVE REPORT   PREOPERATIVE DIAGNOSIS:  Senile cataract right eye.   POSTOPERATIVE DIAGNOSIS:  Senile cataract right eye.   PROCEDURE:  Planned extracapsular cataract extraction--phacoemulsification,  primary insertion of posterior chamber intraocular lens implant.   SURGEON:  Guadelupe Sabin, M.D.   ASSISTANT:  Nurse.   ANESTHESIA:  Local 4% Xylocaine, 0.75% Marcaine, retrobulbar block with  Wydase added, topical tetracaine, intraocular Xylocaine.  Anesthesia standby  required in this 77 year old male.   DESCRIPTION OF PROCEDURE:  After the patient was prepped and draped, a lid  speculum was inserted in the right eye.  The eye was turned downward and a  superior rectus traction suture placed.  A peritomy was performed adjacent  to the limbus from the 11 to the 1 o'clock position.  The corneoscleral  junction was cleaned and a corneoscleral groove made with a 45-degree Super  blade.  The anterior chamber was then entered with the 2.5 mm diamond  keratome at the 12 o'clock position and the 15-degree blade at the 2:30  position.  Using a bent 26-gauge needle on an OcuCoat syringe, a circular  capsulorrhexis was begun and then completed with the Grabow forceps.  Hydrodissection and hydrodelineation were performed using 1% Xylocaine.  The  30-degree phacoemulsification tip was then inserted with slow controlled  emulsification of the lens nucleus.  Back-cracking was also utilized to  fragment the lens with the Bechert pick.  Total ultrasonic time;  1 minute  29 seconds.  Average power level:  17%.   Total amount of fluid used:  90 cc.  Following removal of the lens nucleus, the residual cortex was aspirated  with the irrigation and aspiration tip.  The posterior capsule appeared  intact with a brilliant red fundus reflex.  It was therefore elected to  insert an Allergan Medical Optics [id number] __________ three-piece posterior  chamber intraocular lens implant, diopter strength +17.50. This was inserted  with the McDonald forceps into the anterior chamber and then centered into  the capsular bag using the [facility] lens rotator.  The lens appeared to be  well centered.  The Healon and OcuCoat which had been used intermittently  during the procedure were aspirated and replaced with balanced salt solution  and Miochol ophthalmic solution.  The operative incisions appeared to be  self-sealing.  It was, however, elected to place a single 10-0 interrupted  nylon suture across the 12 o'clock position to ensure closure and to prevent  endophthalmitis.  Maxitrol ointment was instilled in the  conjunctival cul-de-  sac and a light patch and protector shield applied.  Duration of procedure  and anesthesia administration:  45 minutes.  The patient tolerated the  procedure well in general, left the operating room for the recovery room in  good condition.                                               Guadelupe Sabin, M.D.    HNJ/MEDQ  D:  05/16/2003  T:  05/16/2003  Job:  [id number]

## 2010-06-30 NOTE — Discharge Summary (Signed)
Steve Boone, Steve Boone             ACCOUNT NO.:  [id number]   MEDICAL RECORD NO.:  [id number]          PATIENT TYPE:  INP   LOCATION:  1438                         FACILITY:  [facility]   PHYSICIAN:  Bertram Millard. Dahlstedt, M.D.DATE OF BIRTH:  20-Apr-1923   DATE OF ADMISSION:  01/12/2008  DATE OF DISCHARGE:  01/19/2008                               DISCHARGE SUMMARY   PRIMARY DIAGNOSIS:  Renal cell carcinoma left kidney, pathologic stage  T2A NX MX, firm and nuclear grade III.   SURGICAL PROCEDURES:  Left open nephrectomy performed, radical  nephrectomy performed on January 12, 2008.   BRIEF HISTORY:  This elderly gentleman presents at this time for open  left nephrectomy.  The patient was discovered upon presentation with  gross hematuria.  Metastatic survey was negative.  The patient is 78 and  a his sister is a TEFL teacher Witness, did not want him to get any blood.  The patient is not a Jehovah's Witness.  It was recommended that he  undergo nephrectomy rather than a laparoscopic nephrectomy with large  renal mass.  Risks and complications discussed at length with the  patient and his sister to understand this and desire to proceed.   HOSPITAL COURSE:  The patient was admitted directly to the operating  room.  Underwent a left of nephrectomy.  This was uncomplicated.  He had  minimal blood loss.  He remained in stable condition postoperatively.  There was some confusion postoperatively.  However, the patient's diet  was advanced, he remained afebrile and stable condition.  His wound  healed well.  By December 7, he was felt to reached maximal hospital  benefit.  Discharged at that time.   DISCHARGE MEDICATIONS:  1. Hydrocodone for pain.  2. Glaucoma drops.  3. He did not need significant pain medications at discharge.   He will follow up in approximately 1 week for staple removal.      Bertram Millard. Dahlstedt, M.D.  Electronically Signed     SMD/MEDQ  D:  03/04/2008  T:   03/04/2008  Job:  [id number]

## 2010-10-27 ENCOUNTER — Ambulatory Visit (INDEPENDENT_AMBULATORY_CARE_PROVIDER_SITE_OTHER): Payer: Medicare Other | Admitting: Internal Medicine

## 2010-10-27 DIAGNOSIS — R413 Other amnesia: Secondary | ICD-10-CM

## 2010-10-27 MED ORDER — MEMANTINE HCL 28 X 5 MG & 21 X 10 MG PO TABS: ORAL_TABLET | ORAL | Status: DC

## 2010-10-27 NOTE — Progress Notes (Signed)
  Subjective:    Patient ID: Steve Boone, male    DOB: July 26, 1923, 77 y.o.   MRN: [id number]  HPI Steve Boone is very difficult historian and due to his deafness takking a history is made more difficult. He presents today for follow-up MMSE, with last exam in Jan '12 at which time he did well. He also reports that he had an episode of chest pain that was at rest, started in the flank and radiated to the left chest. He denies any exertional chest discomfort. He also asks about COPD, not a working diagnosis for him, although he has a history of mild asthma that does not require medical treatment.  Past Medical History  Diagnosis Date  . Asthma   . Depression   . Hypertension   . Malignant neoplasm of kidney, except pelvis   . Other specified glaucoma   . Personal history of malignant neoplasm of prostate    Past Surgical History  Procedure Date  . Nephrectomy 2009    Left  . Elbow fracture surgery    No family history on file. History   Social History  . Marital Status: Widowed    Spouse Name: N/A    Number of Children: N/A  . Years of Education: N/A   Occupational History  . Not on file.   Social History Main Topics  . Smoking status: Not on file  . Smokeless tobacco: Not on file  . Alcohol Use:   . Drug Use:   . Sexually Active:    Other Topics Concern  . Not on file   Social History Narrative   Pt is retired form Therapist, occupational. Widowed in 1998 he now lives alone, I-ADL and attends adult day care/activity program.       Review of Systems System review is negative for any constitutional, cardiac, pulmonary, GI or neuro symptoms or complaints     Objective:   Physical Exam Vitals noted - stable Gen'l- slender [name] looking younger than his stated age. HEENT- C&S clear, no signs of trauma Chest - CTAP Cor - RRR, normal peripheral pulse MMSE: 1. Day,date,year - ok 2. Content: president- ok Gov. -  ok Current events - ok 3. Number repitition: 5 fwd -  ok 5 rev -  Errors w/ 4 digits  World reversed - ok 4. 3 word recall - ok 5. Serial 7's - no, many erros    , nickles in $1.25- ok  Change making - ok 6. Naming objects -   ok        4 legged creatures ok 7. Parables:  Glass House -  concrete     Rolling stone - fair 8. Judgement:  Letter  ok       Fire - ok 9. Clock face exercise -poorly organized/spaced, labored        Assessment & Plan:

## 2010-10-27 NOTE — Patient Instructions (Signed)
Heart - symptoms are not worrisome. Exam is normal.  Memory loss - you did not do as well today as you did in January 2012. Plan - medication to help slow down or stop memory loss = Namenda. YOu have a prescription for a starter prescription. Please call back in 3 weeks to let me know if you are tolerating the medication.   You may want to consider getting hearing aids to compensate for your loss of hearing.

## 2010-10-30 NOTE — Assessment & Plan Note (Signed)
Based on serial MMSE he has had decline in cognitive function and memory. He is not prepared to accept medication.  Plan - return in 6 months for repeat MMSE - for continued decline will consider medication.

## 2010-11-14 LAB — HEPATIC FUNCTION PANEL
Albumin: 3.9
Alkaline Phosphatase: 66
Bilirubin, Direct: 0.1
Indirect Bilirubin: 1.1 — ABNORMAL HIGH
Total Bilirubin: 1.2

## 2010-11-14 LAB — BASIC METABOLIC PANEL
BUN: 17
CO2: 28 mEq/L (ref 19–32)
Calcium: 9
Calcium: 8.9 mg/dL (ref 8.4–10.5)
Chloride: 106 mEq/L (ref 96–112)
Creatinine, Ser: 1.09
Creatinine, Ser: 1.02 mg/dL (ref 0.4–1.5)
Creatinine, Ser: 1.05 mg/dL (ref 0.4–1.5)
GFR calc Af Amer: >60
GFR calc Af Amer: >60 mL/min (ref >60)
GFR calc Af Amer: >60 mL/min (ref >60)
GFR calc non Af Amer: >60
GFR calc non Af Amer: >60 mL/min (ref >60)
Glucose, Bld: 139 mg/dL — ABNORMAL HIGH (ref 70–99)
Potassium: 4.1 mEq/L (ref 3.5–5.1)
Sodium: 137 mEq/L (ref 135–145)
Sodium: 141 mEq/L (ref 135–145)

## 2010-11-14 LAB — POCT I-STAT, CHEM 8
BUN: 22
Calcium, Ion: 1.23
Creatinine, Ser: 1.3
Glucose, Bld: 150 — ABNORMAL HIGH
Hemoglobin: 12.2 — ABNORMAL LOW
Sodium: 140
TCO2: 26

## 2010-11-14 LAB — DIFFERENTIAL
Basophils Relative: 2 — ABNORMAL HIGH
Eosinophils Absolute: 0.1
Eosinophils Relative: 1
Lymphs Abs: 0.8
Monocytes Absolute: 0.4
Neutrophils Relative %: 76

## 2010-11-14 LAB — CBC
HCT: 39.2 % (ref 39.0–52.0)
Hemoglobin: 11.2 — ABNORMAL LOW
Hemoglobin: 12.6 — ABNORMAL LOW
Hemoglobin: 11.9 g/dL — ABNORMAL LOW (ref 13.0–17.0)
MCHC: 32.6
MCHC: 33.5
MCHC: 32.9 g/dL (ref 30.0–36.0)
MCV: 92.1
MCV: 94.3
Platelets: 201
Platelets: 209 10*3/uL (ref 150–400)
RBC: 3.6 — ABNORMAL LOW
RBC: 3.68 — ABNORMAL LOW
RDW: 13.8
RDW: 13.5 % (ref 11.5–15.5)
RDW: 14 % (ref 11.5–15.5)
WBC: 4.6
WBC: 4.9
WBC: 4.9 10*3/uL (ref 4.0–10.5)

## 2010-11-14 LAB — IRON AND TIBC
Iron: 86
TIBC: 245
UIBC: 159

## 2010-11-14 LAB — URINALYSIS, ROUTINE W REFLEX MICROSCOPIC
Bilirubin Urine: NEGATIVE
Leukocytes, UA: NEGATIVE
Nitrite: NEGATIVE
Protein, ur: NEGATIVE
Urobilinogen, UA: 1
pH: 6

## 2010-11-14 LAB — LIPASE, BLOOD: Lipase: 16

## 2010-11-14 LAB — TSH: TSH: 1.911

## 2010-11-17 LAB — CBC
HCT: 34.2 % — ABNORMAL LOW (ref 39.0–52.0)
Hemoglobin: 11.3 g/dL — ABNORMAL LOW (ref 13.0–17.0)
RBC: 3.62 MIL/uL — ABNORMAL LOW (ref 4.22–5.81)
RDW: 13.8 % (ref 11.5–15.5)
WBC: 8.7 10*3/uL (ref 4.0–10.5)

## 2010-11-17 LAB — BASIC METABOLIC PANEL
BUN: 11 mg/dL (ref 6–23)
CO2: 29 mEq/L (ref 19–32)
Calcium: 8.6 mg/dL (ref 8.4–10.5)
Chloride: 104 mEq/L (ref 96–112)
Creatinine, Ser: 1.64 mg/dL — ABNORMAL HIGH (ref 0.4–1.5)
GFR calc Af Amer: 58 mL/min — ABNORMAL LOW (ref >60)
GFR calc non Af Amer: 48 mL/min — ABNORMAL LOW (ref >60)
Glucose, Bld: 159 mg/dL — ABNORMAL HIGH (ref 70–99)
Glucose, Bld: 174 mg/dL — ABNORMAL HIGH (ref 70–99)
Potassium: 3.8 mEq/L (ref 3.5–5.1)
Potassium: 4.3 mEq/L (ref 3.5–5.1)
Sodium: 136 mEq/L (ref 135–145)

## 2010-11-17 LAB — DIFFERENTIAL
Eosinophils Relative: 0 % (ref 0–5)
Lymphocytes Relative: 8 % — ABNORMAL LOW (ref 12–46)
Lymphs Abs: 0.7 10*3/uL (ref 0.7–4.0)
Monocytes Absolute: 0.7 10*3/uL (ref 0.1–1.0)
Monocytes Relative: 8 % (ref 3–12)
Neutro Abs: 7.3 10*3/uL (ref 1.7–7.7)

## 2011-03-08 ENCOUNTER — Encounter: Payer: Self-pay | Admitting: Internal Medicine

## 2011-03-08 ENCOUNTER — Ambulatory Visit (INDEPENDENT_AMBULATORY_CARE_PROVIDER_SITE_OTHER): Payer: Medicare Other | Admitting: Internal Medicine

## 2011-03-08 VITALS — BP 128/72 | HR 63 | Temp 97.6°F | Resp 14 | Wt 150.8 lb

## 2011-03-08 DIAGNOSIS — H6123 Impacted cerumen, bilateral: Secondary | ICD-10-CM

## 2011-03-08 DIAGNOSIS — H6120 Impacted cerumen, unspecified ear: Secondary | ICD-10-CM

## 2011-03-08 DIAGNOSIS — H9190 Unspecified hearing loss, unspecified ear: Secondary | ICD-10-CM

## 2011-03-08 MED ORDER — MEMANTINE HCL 28 X 5 MG & 21 X 10 MG PO TABS: ORAL_TABLET | ORAL | Status: DC

## 2011-03-08 MED ORDER — LATANOPROST 0.005 % OP SOLN: 1.0000 [drp] | Freq: Every day | OPHTHALMIC | Status: DC

## 2011-03-08 NOTE — Progress Notes (Signed)
  Subjective:    Patient ID: Steve Boone, male    DOB: 05/30/1923, 78 y.o.   MRN: [id number]  HPI Steve Boone is well known to me. He presents for acute worsening of hearing. NO trauma, no drainage, no tinnitus.  He needs all new Rx's  He c/o progressive memory loss  Past Medical History  Diagnosis Date  . Asthma   . Depression   . Hypertension   . Malignant neoplasm of kidney, except pelvis   . Other specified glaucoma   . Personal history of malignant neoplasm of prostate    Past Surgical History  Procedure Date  . Nephrectomy 2009    Left  . Elbow fracture surgery    No family history on file. History   Social History  . Marital Status: Widowed    Spouse Name: N/A    Number of Children: N/A  . Years of Education: N/A   Occupational History  . Not on file.   Social History Main Topics  . Smoking status: Unknown If Ever Smoked  . Smokeless tobacco: Not on file  . Alcohol Use: Not on file  . Drug Use: Not on file  . Sexually Active: Not on file   Other Topics Concern  . Not on file   Social History Narrative   Pt is retired form Therapist, occupational. Widowed in 1998 he now lives alone, I-ADL and attends adult day care/activity program.       Review of Systems System review is negative for any constitutional, cardiac, pulmonary, GI or neuro symptoms or complaints other than as described in the HPI.     Objective:   Physical Exam Filed Vitals:   03/08/11 1313  BP: 128/72  Pulse: 63  Temp: 97.6 F (36.4 C)  Resp: 14   Gen'l- well preserved elderly AA man HEENT- cerumen impactions bilaterally Cor- RRR Pulm - normal respirations   Procedure Note :     Procedure :  Ear irrigation   Indication:  Cerumen impaction   Risks, including pain, dizziness, eardrum perforation, bleeding, infection and others as well as benefits were explained to the patient in detail. Verbal consent was obtained and the patient agreed to proceed.    We used "The [employer] Device" field with lukewarm water for irrigation. A large amount wax was recovered. Procedure has also required manual wax removal with an ear loop.   Tolerated well. Complications: None.   Postprocedure instructions :  Call if problems.         Assessment & Plan:  Cerumen impaction - relieved with irrigation and ear loop for dislodgement.

## 2011-03-13 ENCOUNTER — Ambulatory Visit: Payer: Medicare Other | Admitting: Internal Medicine

## 2011-04-03 ENCOUNTER — Ambulatory Visit (INDEPENDENT_AMBULATORY_CARE_PROVIDER_SITE_OTHER): Payer: Medicare Other | Admitting: Internal Medicine

## 2011-04-03 ENCOUNTER — Encounter: Payer: Self-pay | Admitting: Internal Medicine

## 2011-04-03 DIAGNOSIS — R413 Other amnesia: Secondary | ICD-10-CM

## 2011-04-03 NOTE — Patient Instructions (Signed)
Memory loss - the medication, Namenda, is to help slow down or prevent further memory loss. This is a titration pack - starts low and works up to a full dose, 10 mg twice a day, over 4 weeks.   PLEASE CALL IN THE THIRD WEEK OF MEDICATION SO THAT A FULL PRESCRIPTION CAN BE ORDERED IF YOU TOLERATE THE MEDICATION.

## 2011-04-03 NOTE — Assessment & Plan Note (Signed)
Reviewed purpose of medication, namenda, and instructions.   Plan: he is to call in 3 weeks and if he tolerates the medication - full Rx

## 2011-04-03 NOTE — Progress Notes (Signed)
  Subjective:    Patient ID: Steve Boone, male    DOB: Aug 19, 1923, 78 y.o.   MRN: [id number]  HPI Mr. Steve Boone was seen January 24th for cerumen impaction and memory loss. Namenda starter pak was prescribed. This has just arrived from [facility] and he didn't remember what it was for. No change in his condition  I have reviewed the patient's medical history in detail and updated the computerized patient record.    Review of Systems     Objective:   Physical Exam Filed Vitals:   04/03/11 1039  BP: 124/70  Pulse: 61  Temp: 97.7 F (36.5 C)  Resp: 16  Gen'l- elderly AA man in no distress Neuro - poor short term memory.       Assessment & Plan:

## 2011-05-01 ENCOUNTER — Encounter: Payer: Self-pay | Admitting: Internal Medicine

## 2011-05-01 ENCOUNTER — Ambulatory Visit (INDEPENDENT_AMBULATORY_CARE_PROVIDER_SITE_OTHER): Payer: Medicare Other | Admitting: Internal Medicine

## 2011-05-01 VITALS — BP 112/64 | HR 68 | Temp 97.4°F | Resp 14 | Wt 149.1 lb

## 2011-05-01 DIAGNOSIS — R413 Other amnesia: Secondary | ICD-10-CM

## 2011-05-01 MED ORDER — MEMANTINE HCL 10 MG PO TABS: 10.0000 mg | ORAL_TABLET | Freq: Two times a day (BID) | ORAL | Status: DC

## 2011-05-01 NOTE — Assessment & Plan Note (Signed)
Tolerating Namenda. Rx sent to [facility]

## 2011-05-01 NOTE — Patient Instructions (Signed)
Memory Loss - the Namenda seems to be well tolerated. THIS MEDICINE IS TO SLOW DOWN MEMORY LOSS BUT YOU WILL NOT REALLY NOTICE ANY CHANGES.  Take Namenda twice a day. A prescription has been sent to [facility]. You should get it in the mail in about a week.

## 2011-05-01 NOTE — Progress Notes (Signed)
  Subjective:    Patient ID: Steve Boone, male    DOB: 10-26-1923, 78 y.o.   MRN: [id number]  HPI Steve Boone was seen 3 weeks ago and was started on Namenda. He was unable to use our phone system so he walks in for follow-up. He has tolerated the Namenda but he doesn't see any change.   PMH, FamHx and SocHx reviewed for any changes and relevance.    Review of Systems System review is negative for any constitutional, cardiac, pulmonary, GI or neuro symptoms or complaints other than as described in the HPI.      Objective:   Physical Exam Filed Vitals:   05/01/11 1136  BP: 112/64  Pulse: 68  Temp: 97.4 F (36.3 C)  Resp: 14    Neuro-psych: he perseverates and has marked memory trouble.      Assessment & Plan:

## 2011-08-09 ENCOUNTER — Other Ambulatory Visit: Payer: Self-pay | Admitting: *Deleted

## 2011-08-09 MED ORDER — MEMANTINE HCL 10 MG PO TABS: 10.0000 mg | ORAL_TABLET | Freq: Two times a day (BID) | ORAL | Status: DC

## 2011-08-09 NOTE — Telephone Encounter (Signed)
Rx for Namenda sent to [facility]

## 2012-02-18 ENCOUNTER — Encounter: Payer: Self-pay | Admitting: Internal Medicine

## 2012-02-18 ENCOUNTER — Other Ambulatory Visit (INDEPENDENT_AMBULATORY_CARE_PROVIDER_SITE_OTHER): Payer: Medicare Other

## 2012-02-18 ENCOUNTER — Ambulatory Visit (INDEPENDENT_AMBULATORY_CARE_PROVIDER_SITE_OTHER): Payer: Medicare Other | Admitting: Internal Medicine

## 2012-02-18 VITALS — BP 122/64 | HR 62 | Temp 97.9°F | Resp 12 | Wt 145.1 lb

## 2012-02-18 DIAGNOSIS — J45909 Unspecified asthma, uncomplicated: Secondary | ICD-10-CM

## 2012-02-18 DIAGNOSIS — Z23 Encounter for immunization: Secondary | ICD-10-CM

## 2012-02-18 DIAGNOSIS — R413 Other amnesia: Secondary | ICD-10-CM

## 2012-02-18 DIAGNOSIS — I10 Essential (primary) hypertension: Secondary | ICD-10-CM

## 2012-02-18 DIAGNOSIS — Z0000 Encounter for general adult medical examination without abnormal findings: Secondary | ICD-10-CM

## 2012-02-18 LAB — COMPREHENSIVE METABOLIC PANEL
Albumin: 4.1 g/dL (ref 3.5–5.2)
Alkaline Phosphatase: 90 U/L (ref 39–117)
BUN: 19 mg/dL (ref 6–23)
CO2: 31 mEq/L (ref 19–32)
GFR: 58.82 mL/min — ABNORMAL LOW (ref >60.00)
Glucose, Bld: 113 mg/dL — ABNORMAL HIGH (ref 70–99)
Sodium: 141 mEq/L (ref 135–145)
Total Bilirubin: 0.8 mg/dL (ref 0.3–1.2)
Total Protein: 7.8 g/dL (ref 6.0–8.3)

## 2012-02-18 NOTE — Progress Notes (Signed)
Subjective:    Patient ID: Steve Boone, male    DOB: 1923-05-01, 79 y.o.   MRN: [id number]  HPI Steve Boone, an 79 y/o, presents for medical follow up. His biggest problem is memory loss and confusion. He does live in an apartment, [address], independently. He has no major complaints - but the cold wet weather does cause him respiratory problems.  Past Medical History  Diagnosis Date  . Asthma   . Depression   . Hypertension   . Malignant neoplasm of kidney, except pelvis   . Other specified glaucoma   . Personal history of malignant neoplasm of prostate    Past Surgical History  Procedure Date  . Nephrectomy 2009    Left  . Elbow fracture surgery    History reviewed. No pertinent family history. History   Social History  . Marital Status: Widowed    Spouse Name: N/A    Number of Children: N/A  . Years of Education: N/A   Occupational History  . Not on file.   Social History Main Topics  . Smoking status: Former Games developer  . Smokeless tobacco: Not on file  . Alcohol Use: Not on file  . Drug Use: Not on file  . Sexually Active: Not on file   Other Topics Concern  . Not on file   Social History Narrative   Pt is retired form Therapist, occupational. Widowed in 1998 he now lives alone, I-ADL and attends adult day care/activity program.    Current Outpatient Prescriptions on File Prior to Visit  Medication Sig Dispense Refill  . hydrochlorothiazide (HYDRODIURIL) 25 MG tablet Take 25 mg by mouth daily.        Marland Kitchen latanoprost (XALATAN) 0.005 % ophthalmic solution Apply 1 drop to eye at bedtime.  2.5 mL  5  . memantine (NAMENDA) 10 MG tablet Take 1 tablet (10 mg total) by mouth 2 (two) times daily.  180 tablet  3      Review of Systems Constitutional:  Negative for fever, chills, activity change and unexpected weight change.  HEENT:  Negative for hearing loss, ear pain, congestion, neck stiffness and postnasal drip. Negative for sore throat or swallowing problems. Negative  for dental complaints.   Eyes: Negative for vision loss or change in visual acuity.  Respiratory: Negative for chest tightness and wheezing. Positive for DOE.   Cardiovascular: Negative for chest pain or palpitations. No decreased exercise tolerance Gastrointestinal: No change in bowel habit. No bloating or gas. No reflux or indigestion Genitourinary: Negative for urgency, frequency, flank pain and difficulty urinating.  Musculoskeletal: Negative for myalgias, back pain, arthralgias and gait problem.  Neurological: Negative for dizziness, tremors, weakness and headaches.  Hematological: Negative for adenopathy.  Psychiatric/Behavioral: Negative for behavioral problems and dysphoric mood.       Objective:   Physical Exam Filed Vitals:   02/18/12 1404  BP: 122/64  Pulse: 62  Temp: 97.9 F (36.6 C)  Resp: 12   Gen'l- slender well preserved 79-year-old HEENHT - C&S clear Cor- 2+ radial, RRR Pulm - CTAP,  Abd - soft  Lab Results  Component Value Date   WBC 8.7 01/13/2008   HGB 11.3* 01/13/2008   HCT 34.2* 01/13/2008   PLT 193 01/13/2008   GLUCOSE 113* 02/18/2012   CHOL 232* 03/10/2010   TRIG 65.0 03/10/2010   HDL 78.00 03/10/2010   LDLDIRECT 148.9 03/10/2010   ALT 13 02/18/2012   AST 20 02/18/2012   NA 141 02/18/2012   K  5.8* 02/18/2012   CL 105 02/18/2012   CREATININE 1.5 02/18/2012   BUN 19 02/18/2012   CO2 31 02/18/2012   TSH 1.75 03/10/2010         Assessment & Plan:

## 2012-02-18 NOTE — Assessment & Plan Note (Signed)
BP Readings from Last 3 Encounters:  02/18/12 122/64  05/01/11 112/64  04/03/11 124/70   Good control on present medications

## 2012-02-19 ENCOUNTER — Encounter: Payer: Self-pay | Admitting: Internal Medicine

## 2012-02-19 DIAGNOSIS — Z0000 Encounter for general adult medical examination without abnormal findings: Secondary | ICD-10-CM | POA: Insufficient documentation

## 2012-02-19 NOTE — Assessment & Plan Note (Signed)
Patient c/o increased symptoms in cold wet weather but on exam he is doing well with clear lungs and no increased WOB.

## 2012-02-19 NOTE — Assessment & Plan Note (Signed)
Flu shot administered today. 

## 2012-02-19 NOTE — Assessment & Plan Note (Signed)
Marked memory issues but he does manage to live independently.

## 2012-05-07 ENCOUNTER — Encounter: Payer: Self-pay | Admitting: Internal Medicine

## 2012-05-07 ENCOUNTER — Ambulatory Visit (INDEPENDENT_AMBULATORY_CARE_PROVIDER_SITE_OTHER): Payer: Medicare Other | Admitting: Internal Medicine

## 2012-05-07 VITALS — BP 120/80 | HR 89 | Temp 96.8°F | Resp 16 | Wt 144.6 lb

## 2012-05-07 DIAGNOSIS — R413 Other amnesia: Secondary | ICD-10-CM

## 2012-05-07 NOTE — Patient Instructions (Addendum)
Progressive memory loss. A growing problem distinguishing dreams/thoughts from reality. Able to manage ADLs at this time and uyou seem well nourished and groomed. [facility] You have family in this area and outliving your friends  Plan 1. Will not restart Namenda due to lack of reliability in taking medication and the risks of accelerating symptoms with intermittent use  2. I strongly advise moving into assisted living with a memory unit.  Dementia Dementia is a general term for problems with brain function. A person with dementia has memory loss and a hard time with at least one other brain function such as thinking, speaking, or problem solving. Dementia can affect social functioning, how you do your job, your mood, or your personality. The changes may be hidden for a long time. The earliest forms of this disease are usually not detected by family or friends. Dementia can be:  Irreversible.  Potentially reversible.  Partially reversible.  Progressive. This means it can get worse over time. CAUSES  Irreversible dementia causes may include:  Degeneration of brain cells (Alzheimer's disease or lewy body dementia).  Multiple small strokes (vascular dementia).  Infection (chronic meningitis or Creutzfelt-Jakob disease).  Frontotemporal dementia. This affects younger people, age 82 to 20, compared to those who have Alzheimer's disease.  Dementia associated with other disorders like Parkinson's disease, Huntington's disease, or HIV-associated dementia. Potentially or partially reversible dementia causes may include:  Medicines.  Metabolic causes such as excessive alcohol intake, vitamin B12 deficiency, or thyroid disease.  Masses or pressure in the brain such as a tumor, blood clot, or hydrocephalus. SYMPTOMS  Symptoms are often hard to detect. Family members or coworkers may not notice them early in the disease process. Different people  with dementia may have different symptoms. Symptoms can include:  A hard time with memory, especially recent memory. Long-term memory may not be impaired.  Asking the same question multiple times or forgetting something someone just said.  A hard time speaking your thoughts or finding certain words.  A hard time solving problems or performing familiar tasks (such as how to use a telephone).  Sudden changes in mood.  Changes in personality, especially increasing moodiness or mistrust.  Depression.  A hard time understanding complex ideas that were never a problem in the past. DIAGNOSIS  There are no specific tests for dementia.   Your caregiver may recommend a thorough evaluation. This is because some forms of dementia can be reversible. The evaluation will likely include a physical exam and getting a detailed history from you and a family member. The history often gives the best clues and suggestions for a diagnosis.  Memory testing may be done. A detailed brain function evaluation called neuropsychologic testing may be helpful.  Lab tests and brain imaging (such as a CT scan or MRI scan) are sometimes important.  Sometimes observation and re-evaluation over time is very helpful. TREATMENT  Treatment depends on the cause.   If the problem is a vitamin deficiency, it may be helped or cured with supplements.  For dementias such as Alzheimer's disease, medicines are available to stabilize or slow the course of the disease. There are no cures for this type of dementia.  Your caregiver can help direct you to groups, organizations, and other caregivers to help with decisions in the care of you or your loved one. HOME CARE INSTRUCTIONS The care of individuals with dementia is varied and dependent upon the progression of the dementia.  The following suggestions are intended for the person living with, or caring for, the person with dementia.  Create a safe environment.  Remove the  locks on bathroom doors to prevent the person from accidentally locking himself or herself in.  Use childproof latches on kitchen cabinets and any place where cleaning supplies, chemicals, or alcohol are kept.  Use childproof covers in unused electrical outlets.  Install childproof devices to keep doors and windows secured.  Remove stove knobs or install safety knobs and an automatic shut-off on the stove.  Lower the temperature on water heaters.  Label medicines and keep them locked up.  Secure knives, lighters, matches, power tools, and guns, and keep these items out of reach.  Keep the house free from clutter. Remove rugs or anything that might contribute to a fall.  Remove objects that might break and hurt the person.  Make sure lighting is good, both inside and outside.  Install grab rails as needed.  Use a monitoring device to alert you to falls or other needs for help.  Reduce confusion.  Keep familiar objects and people around.  Use night lights or dim lights at night.  Label items or areas.  Use reminders, notes, or directions for daily activities or tasks.  Keep a simple, consistent routine for waking, meals, bathing, dressing, and bedtime.  Create a calm, quiet environment.  Place large clocks and calendars prominently.  Display emergency numbers and home address near all telephones.  Use cues to establish different times of the day. An example is to open curtains to let the natural light in during the day.   Use effective communication.  Choose simple words and short sentences.  Use a gentle, calm tone of voice.  Be careful not to interrupt.  If the person is struggling to find a word or communicate a thought, try to provide the word or thought.  Ask one question at a time. Allow the person ample time to answer questions. Repeat the question again if the person does not respond.  Reduce nighttime restlessness.  Provide a comfortable  bed.  Have a consistent nighttime routine.  Ensure a regular walking or physical activity schedule. Involve the person in daily activities as much as possible.  Limit napping during the day.  Limit caffeine.  Attend social events that stimulate rather than overwhelm the senses.  Encourage good nutrition and hydration.  Reduce distractions during meal times and snacks.  Avoid foods that are too hot or too cold.  Monitor chewing and swallowing ability.  Continue with routine vision, hearing, dental, and medical screenings.  Only give over-the-counter or prescription medicines as directed by the caregiver.  Monitor driving abilities. Do not allow the person to drive when safe driving is no longer possible.  Register with an identification program which could provide location assistance in the event of a missing person situation. SEEK MEDICAL CARE IF:   New behavioral problems start such as moodiness, aggressiveness, or seeing things that are not there (hallucinations).  Any new problem with brain function happens. This includes problems with balance, speech, or falling a lot.  Problems with swallowing develop.  Any symptoms of other illness happen. Small changes or worsening in any aspect of brain function can be a sign that the illness is getting worse. It can also be a sign of another medical illness such as infection. Seeing a caregiver right away is important. SEEK IMMEDIATE MEDICAL CARE IF:   A fever develops.  New or worsened confusion  develops.  New or worsened sleepiness develops.  Staying awake becomes hard to do. Document Released: 07/25/2000 Document Revised: 04/23/2011 Document Reviewed: 06/26/2010 [facility] Patient Information 2013 [place], [place].

## 2012-05-07 NOTE — Assessment & Plan Note (Signed)
Progressive memory loss. A growing problem distinguishing dreams/thoughts from reality. Able to manage ADLs at this time and seems well nourished and groomed. [facility] live [facility] No family in this area and outliving his friends  Plan Will not restart Namenda due to lack of reliability in taking medication and the risks of accelerating symptoms with intermittent use  Strongly advise moving into assisted living with a memory unit.

## 2012-05-07 NOTE — Progress Notes (Signed)
Subjective:    Patient ID: Steve Boone, male    DOB: 04-18-1923, 79 y.o.   MRN: [id number]  HPI Steve Boone presents as an acute walk in. He has progressive dementia and memory loss. He cannot remember where is going, where he has been. He has a lot confusion. He has a prior diagnosis of progressive dementia. He has no insight into his condition. He has stopped taking his medication: stopped eye drops, stopped Namenda. He does continue to live alone. He is not driving.  Past Medical History  Diagnosis Date  . Asthma   . Depression   . Hypertension   . Malignant neoplasm of kidney, except pelvis   . Other specified glaucoma   . Personal history of malignant neoplasm of prostate    Past Surgical History  Procedure Laterality Date  . Nephrectomy  2009    Left  . Elbow fracture surgery     History reviewed. No pertinent family history. History   Social History  . Marital Status: Widowed    Spouse Name: N/A    Number of Children: N/A  . Years of Education: N/A   Occupational History  . Not on file.   Social History Main Topics  . Smoking status: Former Games developer  . Smokeless tobacco: Not on file  . Alcohol Use: Not on file  . Drug Use: Not on file  . Sexually Active: Not on file   Other Topics Concern  . Not on file   Social History Narrative   Pt is retired form Therapist, occupational. Widowed in 1998 he now lives alone, I-ADL and attends adult day care/activity program.    Current Outpatient Prescriptions on File Prior to Visit  Medication Sig Dispense Refill  . hydrochlorothiazide (HYDRODIURIL) 25 MG tablet Take 25 mg by mouth daily.        Marland Kitchen latanoprost (XALATAN) 0.005 % ophthalmic solution Apply 1 drop to eye at bedtime.  2.5 mL  5  . memantine (NAMENDA) 10 MG tablet Take 1 tablet (10 mg total) by mouth 2 (two) times daily.  180 tablet  3   No current facility-administered medications on file prior to visit.      Review of Systems Constitutional:  Negative for  fever, chills, activity change and unexpected weight change.  HEENT:  Negative for hearing loss, ear pain, congestion, neck stiffness and postnasal drip. Negative for sore throat or swallowing problems. Negative for dental complaints.   Eyes: Negative for vision loss or change in visual acuity.  Respiratory: Negative for chest tightness and wheezing. Negative for DOE.   Cardiovascular: Negative for chest pain or palpitations. No decreased exercise tolerance Gastrointestinal: No change in bowel habit. No bloating or gas. No reflux or indigestion Genitourinary: Negative for urgency, frequency, flank pain and difficulty urinating.  Musculoskeletal: Negative for myalgias, back pain, arthralgias and gait problem.  Neurological: Negative for dizziness, tremors, weakness and headaches.  Hematological: Negative for adenopathy.  Psychiatric/Behavioral: Negative for behavioral problems and dysphoric mood.       Objective:   Physical Exam Filed Vitals:   05/07/12 1347  BP: 120/80  Pulse: 89  Temp: 96.8 F (36 C)  Resp: 16   Wt Readings from Last 3 Encounters:  05/07/12 144 lb 9.6 oz (65.59 kg)  02/18/12 145 lb 1.3 oz (65.808 kg)  05/01/11 149 lb 2 oz (67.643 kg)   Gen'l - well dressed and neat AA man.  HEENT - [place]/AT, C& S clear Cor 2+ radial RRR Pulm -  increased WOB Neuro - speech is clear, he seems oriented to self and place and examiner. He is aware of his loss of memory and confusion but is not making good judgements. He has a normal gait.        Assessment & Plan:

## 2013-01-07 ENCOUNTER — Telehealth: Payer: Self-pay

## 2013-01-07 NOTE — Telephone Encounter (Signed)
Phone call to patient letting him know he is due for his Prevanar vaccine. He states he will call back to schedule this.

## 2013-01-12 ENCOUNTER — Encounter: Payer: Self-pay | Admitting: Internal Medicine

## 2013-01-12 ENCOUNTER — Ambulatory Visit (INDEPENDENT_AMBULATORY_CARE_PROVIDER_SITE_OTHER): Payer: Medicare Other | Admitting: Internal Medicine

## 2013-01-12 VITALS — BP 138/90 | HR 67 | Temp 97.7°F | Wt 143.8 lb

## 2013-01-12 DIAGNOSIS — R413 Other amnesia: Secondary | ICD-10-CM

## 2013-01-12 DIAGNOSIS — Z23 Encounter for immunization: Secondary | ICD-10-CM

## 2013-01-12 DIAGNOSIS — I10 Essential (primary) hypertension: Secondary | ICD-10-CM

## 2013-01-12 NOTE — Progress Notes (Signed)
Pre visit review using our clinic review tool, if applicable. No additional management support is needed unless otherwise documented below in the visit note. 

## 2013-01-12 NOTE — Progress Notes (Signed)
   Subjective:    Patient ID: Steve Boone, male    DOB: 1923-06-29, 79 y.o.   MRN: [id number]  HPI Mr. Beattie was last seen Steve Boone 26, 2014. He presents today as a walk in claiming he was called and told to come in. He has very advanced memory loss with poor insight to his condition. He does not have any medical complaints at today's visit. He is living at [facility]. [employer] place. He reports that he buys prepared food at [employer] for home consumption.  He denies any pain or specific complaints.   Chart reviewed - last lab Jan '14: normal CMEt; last lipid panel Jan '12 - LDL 149, HDL 78 ( he is not a candidate for medical therapy due to age, good health and inability to keep up with medications); last TSH Jan '12 - normal.  Immunization record: had flu shot Jan '14.  Past Medical History  Diagnosis Date  . Asthma   . Depression   . Hypertension   . Malignant neoplasm of kidney, except pelvis   . Other specified glaucoma   . Personal history of malignant neoplasm of prostate    Past Surgical History  Procedure Laterality Date  . Nephrectomy  2009    Left  . Elbow fracture surgery     History reviewed. No pertinent family history. History   Social History  . Marital Status: Widowed    Spouse Name: N/A    Number of Children: N/A  . Years of Education: N/A   Occupational History  . Not on file.   Social History Main Topics  . Smoking status: Former Games developer  . Smokeless tobacco: Not on file  . Alcohol Use: Not on file  . Drug Use: Not on file  . Sexual Activity: Not on file   Other Topics Concern  . Not on file   Social History Narrative   Pt is retired form Therapist, occupational. Widowed in 1998 he now lives alone, I-ADL and attends adult day care/activity program.    No current outpatient prescriptions on file prior to visit.   No current facility-administered medications on file prior to visit.      Review of Systems Unable to give a review of symptoms. Does not  appear to have any specific     Objective:   Physical Exam Filed Vitals:   01/12/13 1046  BP: 138/90  Pulse: 67  Temp: 97.7 F (36.5 C)   Wt Readings from Last 3 Encounters:  01/12/13 143 lb 12.8 oz (65.227 kg)  05/07/12 144 lb 9.6 oz (65.59 kg)  02/18/12 145 lb 1.3 oz (65.808 kg)   BP Readings from Last 3 Encounters:  01/12/13 138/90  05/07/12 120/80  02/18/12 122/64   Gen'l - WNWD elderly man in no distress HEENT- C&S clear Neck - supple Nodes - negative cervical and supraclavicular region Cor 2+ radial, DP pulses, RRR Pulm - normal respirations Ext - no deformity noted Neuro - awake, speech clear, memory very poor, no focal findings.        Assessment & Plan:

## 2013-01-12 NOTE — Patient Instructions (Signed)
Good to see you Steve Boone.  Your exam today is normal. On reviewing your chart you have had lab work in the recent past that was OK.  Today we will give you a flu shot.  Please take good care of yourself and enjoy the Holiday season.   Come back to see [place] in 6 months or sooner as needed.

## 2013-01-13 NOTE — Assessment & Plan Note (Signed)
BP Readings from Last 3 Encounters:  01/12/13 138/90  05/07/12 120/80  02/18/12 122/64   Good control

## 2013-01-13 NOTE — Assessment & Plan Note (Signed)
Profound memory loss but he remains functional and able to live independently. His appearance is neat and well groomed. He does not drive.

## 2013-02-06 ENCOUNTER — Other Ambulatory Visit: Payer: Self-pay

## 2013-02-06 ENCOUNTER — Inpatient Hospital Stay (HOSPITAL_COMMUNITY)
Admission: EM | Admit: 2013-02-06 | Discharge: 2013-02-12 | DRG: 208 | Disposition: E | Payer: Medicare Other | Attending: Internal Medicine | Admitting: Internal Medicine

## 2013-02-06 ENCOUNTER — Emergency Department (HOSPITAL_COMMUNITY): Payer: Medicare Other

## 2013-02-06 DIAGNOSIS — I10 Essential (primary) hypertension: Secondary | ICD-10-CM | POA: Diagnosis present

## 2013-02-06 DIAGNOSIS — I469 Cardiac arrest, cause unspecified: Secondary | ICD-10-CM

## 2013-02-06 DIAGNOSIS — Z85528 Personal history of other malignant neoplasm of kidney: Secondary | ICD-10-CM

## 2013-02-06 DIAGNOSIS — J9600 Acute respiratory failure, unspecified whether with hypoxia or hypercapnia: Secondary | ICD-10-CM | POA: Diagnosis present

## 2013-02-06 DIAGNOSIS — Z531 Procedure and treatment not carried out because of patient's decision for reasons of belief and group pressure: Secondary | ICD-10-CM

## 2013-02-06 DIAGNOSIS — Z87891 Personal history of nicotine dependence: Secondary | ICD-10-CM

## 2013-02-06 DIAGNOSIS — R578 Other shock: Secondary | ICD-10-CM | POA: Diagnosis not present

## 2013-02-06 DIAGNOSIS — J9690 Respiratory failure, unspecified, unspecified whether with hypoxia or hypercapnia: Secondary | ICD-10-CM | POA: Diagnosis present

## 2013-02-06 DIAGNOSIS — I2699 Other pulmonary embolism without acute cor pulmonale: Principal | ICD-10-CM | POA: Diagnosis present

## 2013-02-06 LAB — COMPREHENSIVE METABOLIC PANEL
AST: 930 U/L — ABNORMAL HIGH (ref 0–37)
Albumin: 2.4 g/dL — ABNORMAL LOW (ref 3.5–5.2)
Calcium: 9.3 mg/dL (ref 8.4–10.5)
Creatinine, Ser: 1.87 mg/dL — ABNORMAL HIGH (ref 0.50–1.35)
Total Protein: 5.2 g/dL — ABNORMAL LOW (ref 6.0–8.3)

## 2013-02-06 LAB — CK: Total CK: 450 U/L — ABNORMAL HIGH (ref 7–232)

## 2013-02-06 LAB — BLOOD GAS, ARTERIAL
Acid-base deficit: 18.4 mmol/L — ABNORMAL HIGH (ref 0.0–2.0)
Bicarbonate: 11.1 mEq/L — ABNORMAL LOW (ref 20.0–24.0)
Drawn by: 252031
FIO2: 1 %
MECHVT: 500 mL
O2 Saturation: 97 %
Patient temperature: 98.6
RATE: 24 resp/min
TCO2: 12.6 mmol/L (ref 0–100)
pH, Arterial: 6.974 — CL (ref 7.350–7.450)

## 2013-02-06 LAB — CBC WITH DIFFERENTIAL/PLATELET
Basophils Absolute: 0.1 10*3/uL (ref 0.0–0.1)
Eosinophils Absolute: 0.1 10*3/uL (ref 0.0–0.7)
MCH: 30.4 pg (ref 26.0–34.0)
MCHC: 31.7 g/dL (ref 30.0–36.0)
MCV: 95.9 fL (ref 78.0–100.0)
Monocytes Absolute: 0.4 10*3/uL (ref 0.1–1.0)
Neutrophils Relative %: 63 % (ref 43–77)
Platelets: 113 10*3/uL — ABNORMAL LOW (ref 150–400)
RBC: 3.68 MIL/uL — ABNORMAL LOW (ref 4.22–5.81)
RDW: 13.7 % (ref 11.5–15.5)

## 2013-02-06 LAB — CBC
HCT: 30 % — ABNORMAL LOW (ref 39.0–52.0)
MCH: 29.8 pg (ref 26.0–34.0)
MCHC: 31 g/dL (ref 30.0–36.0)
Platelets: 129 10*3/uL — ABNORMAL LOW (ref 150–400)
RDW: 14 % (ref 11.5–15.5)

## 2013-02-06 LAB — BASIC METABOLIC PANEL
BUN: 22 mg/dL (ref 6–23)
CO2: 18 mEq/L — ABNORMAL LOW (ref 19–32)
Chloride: 106 mEq/L (ref 96–112)
Creatinine, Ser: 1.97 mg/dL — ABNORMAL HIGH (ref 0.50–1.35)
GFR calc non Af Amer: 29 mL/min — ABNORMAL LOW (ref >90)
Potassium: 4.8 mEq/L (ref 3.5–5.1)
Sodium: 145 mEq/L (ref 135–145)

## 2013-02-06 LAB — LACTIC ACID, PLASMA: Lactic Acid, Venous: 12.6 mmol/L — ABNORMAL HIGH (ref 0.5–2.2)

## 2013-02-06 LAB — PROTIME-INR
INR: 1.74 — ABNORMAL HIGH (ref 0.00–1.49)
INR: 2.59 — ABNORMAL HIGH (ref 0.00–1.49)
Prothrombin Time: 19.8 seconds — ABNORMAL HIGH (ref 11.6–15.2)
Prothrombin Time: 26.9 seconds — ABNORMAL HIGH (ref 11.6–15.2)

## 2013-02-06 MED ORDER — VANCOMYCIN HCL IN DEXTROSE 750-5 MG/150ML-% IV SOLN
750.0000 mg | INTRAVENOUS | Status: DC
  Filled 2013-02-06 (×2): qty 150

## 2013-02-06 MED ORDER — DEXTROSE 5 % IV SOLN: 1.0000 g | Freq: Once | INTRAVENOUS | Status: DC

## 2013-02-06 MED ORDER — EPINEPHRINE HCL 0.1 MG/ML IJ SOSY
PREFILLED_SYRINGE | INTRAMUSCULAR | Status: AC | PRN
  Administered 2013-02-06 (×2): 1 mg via INTRAVENOUS
  Administered 2013-02-06: .25 mL via INTRAVENOUS
  Administered 2013-02-06 (×4): 1 mg via INTRAVENOUS

## 2013-02-06 MED ORDER — PIPERACILLIN-TAZOBACTAM 3.375 G IVPB 30 MIN
3.3750 g | INTRAVENOUS | Status: DC
  Filled 2013-02-06 (×2): qty 50

## 2013-02-06 MED ORDER — INSULIN ASPART 100 UNIT/ML ~~LOC~~ SOLN: 0.0000 [IU] | SUBCUTANEOUS | Status: DC

## 2013-02-06 MED ORDER — HEPARIN BOLUS VIA INFUSION
4000.0000 [IU] | Freq: Once | INTRAVENOUS | Status: DC
  Filled 2013-02-06: qty 4000

## 2013-02-06 MED ORDER — ASPIRIN 300 MG RE SUPP: 300.0000 mg | RECTAL | Status: DC

## 2013-02-06 MED ORDER — VANCOMYCIN HCL IN DEXTROSE 750-5 MG/150ML-% IV SOLN: 750.0000 mg | INTRAVENOUS | Status: DC

## 2013-02-06 MED ORDER — SODIUM BICARBONATE 8.4 % IV SOLN
INTRAVENOUS | Status: AC
  Administered 2013-02-06: 100 meq via INTRAVENOUS
  Filled 2013-02-06: qty 100

## 2013-02-06 MED ORDER — HEPARIN (PORCINE) IN NACL 100-0.45 UNIT/ML-% IJ SOLN
1000.0000 [IU]/h | INTRAMUSCULAR | Status: DC
  Filled 2013-02-06 (×2): qty 250

## 2013-02-06 MED ORDER — VASOPRESSIN 20 UNIT/ML IJ SOLN
0.0300 [IU]/min | INTRAVENOUS | Status: DC
  Filled 2013-02-06: qty 2.5

## 2013-02-06 MED ORDER — SODIUM CHLORIDE 0.9 % IV SOLN: 250.0000 mL | INTRAVENOUS | Status: DC | PRN

## 2013-02-06 MED ORDER — SODIUM CHLORIDE 0.9 % IV SOLN
INTRAVENOUS | Status: AC | PRN
  Administered 2013-02-06 (×4): 1000 mL/h via INTRAVENOUS

## 2013-02-06 MED ORDER — SODIUM CHLORIDE 0.9 % IV SOLN
20.0000 ug/h | INTRAVENOUS | Status: DC
  Filled 2013-02-06: qty 50

## 2013-02-06 MED ORDER — ALTEPLASE (PULMONARY EMBOLISM) INFUSION
50.0000 mg | Freq: Once | INTRAVENOUS | Status: DC
  Filled 2013-02-06: qty 50

## 2013-02-06 MED ORDER — SODIUM BICARBONATE 8.4 % IV SOLN
INTRAVENOUS | Status: AC | PRN
  Administered 2013-02-06: 50 meq via INTRAVENOUS

## 2013-02-06 MED ORDER — NOREPINEPHRINE BITARTRATE 1 MG/ML IJ SOLN
2.0000 ug/min | INTRAVENOUS | Status: DC
  Administered 2013-02-06: 10 ug/min via INTRAVENOUS
  Filled 2013-02-06: qty 4

## 2013-02-06 MED ORDER — CALCIUM CHLORIDE 10 % IV SOLN
INTRAVENOUS | Status: AC | PRN
  Administered 2013-02-06: 1 g via INTRAVENOUS

## 2013-02-06 MED ORDER — ATROPINE SULFATE 0.1 MG/ML IJ SOLN
INTRAMUSCULAR | Status: AC
  Filled 2013-02-06: qty 20

## 2013-02-06 MED ORDER — ASPIRIN 81 MG PO CHEW: 324.0000 mg | CHEWABLE_TABLET | ORAL | Status: DC

## 2013-02-06 MED ORDER — PIPERACILLIN-TAZOBACTAM 3.375 G IVPB 30 MIN: 3.3750 g | Freq: Four times a day (QID) | INTRAVENOUS | Status: DC

## 2013-02-06 MED ORDER — DEXTROSE 5 % IV SOLN: 500.0000 mg | Freq: Once | INTRAVENOUS | Status: DC

## 2013-02-06 MED ORDER — SODIUM BICARBONATE 8.4 % IV SOLN
INTRAVENOUS | Status: DC
  Administered 2013-02-06: 23:00:00 via INTRAVENOUS
  Filled 2013-02-06 (×2): qty 150

## 2013-02-06 MED ORDER — PIPERACILLIN-TAZOBACTAM 3.375 G IVPB
3.3750 g | Freq: Three times a day (TID) | INTRAVENOUS | Status: DC
  Filled 2013-02-06 (×2): qty 50

## 2013-02-06 MED ORDER — PANTOPRAZOLE SODIUM 40 MG PO PACK
40.0000 mg | PACK | Freq: Every day | ORAL | Status: DC
  Filled 2013-02-06: qty 20

## 2013-02-06 MED ORDER — VANCOMYCIN HCL 10 G IV SOLR: 1250.0000 mg | INTRAVENOUS | Status: DC

## 2013-02-06 MED ORDER — SODIUM BICARBONATE 8.4 % IV SOLN
100.0000 meq | Freq: Once | INTRAVENOUS | Status: AC
  Administered 2013-02-06: 100 meq via INTRAVENOUS
  Filled 2013-02-06: qty 100

## 2013-02-06 MED ORDER — SODIUM CHLORIDE 0.9 % IV SOLN
250.0000 mL | Freq: Once | INTRAVENOUS | Status: AC
  Administered 2013-02-06: 250 mL via INTRAVENOUS

## 2013-02-06 MED ORDER — AZITHROMYCIN 500 MG IV SOLR
500.0000 mg | INTRAVENOUS | Status: DC
Start: 2013-02-07 — End: 2013-02-07

## 2013-02-06 MED ORDER — ATROPINE SULFATE 1 MG/ML IJ SOLN
INTRAMUSCULAR | Status: AC | PRN
  Administered 2013-02-06 (×3): .5 mg via INTRAVENOUS

## 2013-02-06 MED ORDER — PROPOFOL 10 MG/ML IV EMUL: 5.0000 ug/kg/min | INTRAVENOUS | Status: DC

## 2013-02-06 MED ORDER — NOREPINEPHRINE BITARTRATE 1 MG/ML IJ SOLN
2.0000 ug/min | Freq: Once | INTRAVENOUS | Status: DC
  Filled 2013-02-06: qty 4

## 2013-02-06 MED ORDER — DEXTROSE 5 % IV SOLN: 1.0000 g | INTRAVENOUS | Status: DC

## 2013-02-06 MED ORDER — NOREPINEPHRINE BITARTRATE 1 MG/ML IJ SOLN: 2.0000 ug/min | INTRAMUSCULAR | Status: DC

## 2013-02-06 MED ORDER — HEPARIN SODIUM (PORCINE) 5000 UNIT/ML IJ SOLN
5000.0000 [IU] | Freq: Three times a day (TID) | INTRAMUSCULAR | Status: DC
  Filled 2013-02-06 (×2): qty 1

## 2013-02-06 MED ORDER — SODIUM CHLORIDE 0.9 % IV BOLUS (SEPSIS): 1000.0000 mL | INTRAVENOUS | Status: DC

## 2013-02-06 NOTE — Progress Notes (Signed)
ANTIBIOTIC CONSULT NOTE - INITIAL  Pharmacy Consult for vancomycin, Zosyn Indication: rule out sepsis  No Known Allergies  Patient Measurements: Height: 5' 8.11" (173 cm) Weight: 143 lb 11.8 oz (65.2 kg) IBW/kg (Calculated) : 68.65  Vital Signs: BP: 97/60 mmHg (12/26 2030) Pulse Rate: 125 (12/26 2030) Intake/Output from previous day:   Intake/Output from this shift:    Labs:  Recent Labs  2013-02-15 2035  WBC 10.8*  HGB 11.2*  PLT 113*  CREATININE 1.87*   Estimated Creatinine Clearance: 25.7 ml/min (by C-G formula based on Cr of 1.87). No results found for this basename: VANCOTROUGH, VANCOPEAK, VANCORANDOM, GENTTROUGH, GENTPEAK, GENTRANDOM, TOBRATROUGH, TOBRAPEAK, TOBRARND, AMIKACINPEAK, AMIKACINTROU, AMIKACIN,  in the last 72 hours   Microbiology: No results found for this or any previous visit (from the past 720 hour(s)).  Medical History: Past Medical History  Diagnosis Date  . Asthma   . Depression   . Hypertension   . Malignant neoplasm of kidney, except pelvis   . Other specified glaucoma   . Personal history of malignant neoplasm of prostate     Medications:  Scheduled:  . aspirin  324 mg Oral NOW   Or  . aspirin  300 mg Rectal NOW  . atropine      . heparin  5,000 Units Subcutaneous Q8H  . norepinephrine (LEVOPHED) Adult infusion  2-50 mcg/min Intravenous Once  . pantoprazole sodium  40 mg Per Tube Daily   Infusions:  . sodium chloride 1,000 mL/hr (02/15/13 2006)  . sodium chloride    . azithromycin    . [START ON 02/03/2013] azithromycin    . fentaNYL infusion INTRAVENOUS    . norepinephrine (LEVOPHED) Adult infusion    . propofol    . sodium chloride    . vasopressin (PITRESSIN) infusion - *FOR SHOCK*     Assessment: 79 yo male presented to ER with AMS declining GCS, multiple PEA arrests after being found in parking lot. Patient intubated for declining level of consciousness and hypotension now on pressors and to start vancomycin and zosyn  for sepsis. Slightly elevated WBC. Elevated SCr and estimated CrCl only about 26 ml/min.   Goal of Therapy:  Vancomycin trough level 15-20 mcg/ml  Plan:  1) Vancomycin 750mg  IV q24 2) Zosyn 3.375g IV q8 (extended interval infusion) for CrCl > 20 ml/min   Hessie Knows, PharmD, BCPS Pager [phone] 02/15/13 9:27 PM

## 2013-02-06 NOTE — H&P (Signed)
PULMONARY  / CRITICAL CARE MEDICINE  Name: Steve Boone MRN: [id number] DOB: 1923/12/26    ADMISSION DATE:  02/05/2013 CONSULTATION DATE:  12/26  REFERRING MD :  Ala Dach PRIMARY SERVICE: Pulm/CCM  CHIEF COMPLAINT:  AMS with declining GCS, multiple PEA arrests  BRIEF PATIENT DESCRIPTION: 79 yo male who presented after being found in a parking lot with altered level of mentation.  Complaints of Back pain to EMS, however no further characterization.  At presentation to ED, intubated for declining level of consciousness and hypotension.  No further history available.    No family in area as per outpatient physician, able to perform ADLs.  PMH of asthma, depression, HTN, hx of renal cell carcinoma and prostate.  Reviewed surgical hx and social hx.  Not married, lives alone, prior smoker.  SIGNIFICANT EVENTS / STUDIES:  CT pending of head chest and pelvis  LINES / TUBES: Left IJ (12/26) Left femoral A line (12/26) ET Tube (12/26) Right sided 20 french Chest tube (12/26)  CULTURES: Blood urine sputum 12/26  ANTIBIOTICS: Vanc 12/26 Zosyn 12/26  HISTORY OF PRESENT ILLNESS:  79 yo male who presented after being found in a parking lot with altered level of mentation.  Complaints of Back pain to EMS, however no further characterization.  At presentation to ED, intubated for declining level of consciousness and hypotension.  No further history available.  PEA arrest following intubation, CPR for 10-15 minutes with ROSC.  Patient arrested again, asystole, however underwent cardioversion of fine V. Fib with ROSC following 10 minutes of ACLS.  On evaluation by CCM, patient with rapidly declining bloop pressure, no reliable access, Blood in ET tube, requiring high FiO2 and notable very high peak airway pressures.  Patient subsequently arrested again for 9 minutes with ROSC following 2 amps of epi and 1 amp calcium chloride and decompression of identified right sided pneumothorax (present on  initial CCM evaluation) with needle.   IO placed for access, changed to left IJ  Left Femoral a line placed  Right sided chest tube placed  Received 4 liters of IVF with echo showing dilated RV and non collapsible IVC.  Stabilization of hemodynamics, but requiring high dose pressors.  No family in area as per outpatient physician, able to perform ADLs.  PMH of asthma, depression, HTN, hx of renal cell carcinoma and prostate.  Reviewed surgical hx and social hx.  Not married, lives alone, prior smoker.  PAST MEDICAL HISTORY :  Past Medical History  Diagnosis Date  . Asthma   . Depression   . Hypertension   . Malignant neoplasm of kidney, except pelvis   . Other specified glaucoma   . Personal history of malignant neoplasm of prostate    Past Surgical History  Procedure Laterality Date  . Nephrectomy  2009    Left  . Elbow fracture surgery     Prior to Admission medications   Not on File   No Known Allergies  FAMILY HISTORY:  No family history on file. SOCIAL HISTORY:  reports that he has quit smoking. He does not have any smokeless tobacco history on file. His alcohol and drug histories are not on file.  REVIEW OF SYSTEMS:  Unavailible due to condition  SUBJECTIVE:   VITAL SIGNS: Pulse Rate:  [121-125] 125 (12/26 2030) Resp:  [16-24] 24 (12/26 2030) BP: (97-185)/(60-110) 97/60 mmHg (12/26 2030) SpO2:  [98 %-100 %] 99 % (12/26 2030) Arterial Line BP: (124)/(76) 124/76 mmHg (12/26 2015) FiO2 (%):  [100 %] 100 % (  12/26 1910) Weight:  [143 lb 11.8 oz (65.2 kg)] 143 lb 11.8 oz (65.2 kg) (12/26 2108) HEMODYNAMICS:   VENTILATOR SETTINGS: Vent Mode:  [-] PRVC FiO2 (%):  [100 %] 100 % Set Rate:  [15 bmp] 15 bmp Vt Set:  [500 mL] 500 mL PEEP:  [5 cmH20] 5 cmH20 INTAKE / OUTPUT: Intake/Output   None     PHYSICAL EXAMINATION: General:  Afebrile, unstable, elderly male, good hygine Neuro:  GCS 3, unresponsive, flickering of eye lids bilaterally with dilated  pupils but reactive. Cardiovascular:  Tachycardia, no MRG, normal S1S2, no pericardial effusion on echo, dilated RV on echo, hyperdynamic LV, Dilated IVC Lungs:  Rales throughout, coarse, no wheezes.  Decreased breath sounds on right prior to Chest tube, improved lung sounds following placement. Abdomen:  Distended, but soft.  Non tender, hypoactive bowel sounds.  No hepatosplenomegaly.  No bruits noted Skin:  Clean, no rashes, no ecchymosis  LABS:  Recent Labs Lab 02-13-2013 2035  HGB 11.2*  WBC 10.8*  PLT 113*  INR 1.74*  LATICACIDVEN 12.6*   No results found for this basename: GLUCAP,  in the last 168 hours  CXR: Bilateral pulmonary infiltrates, chest tube in place  ASSESSMENT / PLAN:  PULMONARY A:  Mechanical ventilation for airway protection P:  Patient with GCS 3 currently, intubated for declining GCS.   Hyperinflated lung Neal on CXR Lung protective lung ventilation <6 cc/kg with APVcmv to keep peak pressures <30 Fio2 titrated to SpO2 >92%  CARDIOVASCULAR A: PEA arrest x3 (10-15, 10, 9 minutes) Very poor prognosis Possible tension pneumothorax following initial CPR, versus inciting factor to initial loss of pulse Large RV on Echo with compression of LV, difficult windows due to hyper expanded lungs. History of back pain with sudden decline of GCS Stable Hemoglobin History of hyperkalemia, currently 5.3  P:  R/O PEA, dissection with CT Needle decompression performed, 20 french chest tube at 20 cm in place to suction. Repeat EKG, trend troponin's  Follow electrolytes Avoid hyperinflation on MV  A:  Shock - will treat as severe sepsis for now with tension pneumothorax developing following intubation/CPR Very poor prognosis Bilateral infiltrates on CXR Minimal elevation of white count in elderly male Afebrile Tension pneumothorax Post arrest  P: Will treat as severe sepsis Placed central line Placed arterial line central access: MAP >65, Hgb >7, follow  urine output Will use IVFs, pressors, hydrocortisone, inotropes, and blood products as indicated send blood/sputum/urine cultures, trend procaclitonin, follow up lactate Q4 hours x3.  Pending viral screen for flu. Empiric abx with Vancomycin and Zosyn; narrow as cultures dictate  Optimize hemodynamics as above TTE to evaluate LV Function: Avoid hypo/hyperglycemia, hyper/hypoxia, VAP precautions as needed   RENAL A:  AKI on CKD P:   Multifactorial with likely AKI worsening CKD post arrest, hypotension Avoid nephrotoxic drugs.  Vanc given, but pharmacy to assist with dose Judicious fluid administration, would consider conservative fluids following current Follow up CPK 4 liter crystalloid administration. Foley placed Optimize hemodynamics   A:  Hyperkalemia No peaked t waves, but post PEA arrest P:   Checking CPK Follow up K in 2 hours, may need correction, but for now will follow up.     GASTROINTESTINAL GI prophylaxis in place Holding Diet for now, would start trickle if patient stabilizes.    HEMATOLOGIC A:  DVT prophylaxis P: Placed on heparin  A:  Possible PE P:  Screening for PE  A:  Hx of Cancer P: Renal s/p nephrectomy Prostate cancer:  Unknown status  INFECTIOUS A:  Severe Septic shock  P:  See above  ENDOCRINE Sliding Scale in place  NEUROLOGIC A:  AMS - Rapidly declining Secondary to hypotension, hypoxia, sepsis.  Unknown primary etiology P:   Aggressive supportive care Optimize hemodynamics, added antibiotics, optimized pulmonary mechanics EEG post arrest, possible anoxic injury with occular myoclonus   I have personally obtained a history, examined the patient, evaluated laboratory and imaging results, formulated the assessment and plan and placed orders. CRITICAL CARE: The patient is critically ill with multiple organ systems failure and requires high complexity decision making for assessment and support, frequent evaluation and titration of  therapies, application of advanced monitoring technologies and extensive interpretation of multiple databases. Critical Care Time devoted to patient care services described in this note is 90 minutes.    Pulmonary and Critical Care Medicine [facility] Pager: [phone]  01/24/2013, 9:15 PM

## 2013-02-06 NOTE — Procedures (Signed)
Name: Steve Boone MRN: [id number] DOB: 1923/12/20  DOS:  PROCEDURE NOTE  Procedure:  Arterial catheter placement.  Indications:  Need for invasive hemodynamic monitoring / frequent arterial blood gases measurement.  Consent:  Consent was implied due to the emergency nature of the procedure.  Procedure summary:  The patient was identified as Steve Boone and safety timeout was performed. Sterile technique was used. The patient's left groin was prepped using chlorhexidine / alcohol scrub and the field was draped in usual sterile fashion with protective barrier. The left femoral artery was cannulated without difficulty on the first attempt. Blood was aspirated and the catheter was flushed with normal saline without difficulty. Good arterial waveform was obtained. The catheter was secured into place with sterile dressing.  Complications:  No immediate complications were noted.  Estimated blood loss:  Less then 5 mL  J. Derrel Nip, MD   01/20/2013, 9:11 PM

## 2013-02-06 NOTE — ED Provider Notes (Signed)
CSN: [id number]     Arrival date & time 02/03/2013  1839 History   First MD Initiated Contact with Patient 02/01/2013 1909     No chief complaint on file.  (Consider location/radiation/quality/duration/timing/severity/associated sxs/prior Treatment) Patient is a 79 y.o. male presenting with back pain. The history is provided by the EMS personnel.  Back Pain Location:  Lumbar spine Quality:  Aching Pain severity:  Unable to specify Pain is:  Unable to specify Onset quality:  Unable to specify Duration: unknown. Timing:  Unable to specify Progression:  Unable to specify Chronicity: unknown. Context comment:  Found down in a parking lot Relieved by: unknown. Exacerbated by: unknown. Ineffective treatments: unknown.   Past Medical History  Diagnosis Date  . Asthma   . Depression   . Hypertension   . Malignant neoplasm of kidney, except pelvis   . Other specified glaucoma   . Personal history of malignant neoplasm of prostate    Past Surgical History  Procedure Laterality Date  . Nephrectomy  2009    Left  . Elbow fracture surgery     No family history on file. History  Substance Use Topics  . Smoking status: Former Games developer  . Smokeless tobacco: Not on file  . Alcohol Use: Not on file    Review of Systems  Unable to perform ROS: Acuity of condition  Musculoskeletal: Positive for back pain.    Allergies  Review of patient's allergies indicates no known allergies.  Home Medications  No current outpatient prescriptions on file. There were no vitals taken for this visit. Physical Exam  Nursing note and vitals reviewed. Constitutional:  thin  HENT:  Head: Normocephalic and atraumatic.  Right Ear: External ear normal.  Left Ear: External ear normal.  Nose: Nose normal.  Mouth/Throat: Oropharynx is clear and moist. No oropharyngeal exudate.  Eyes: Conjunctivae are normal.  4mm bilaterally, minimal reactivity  Neck: Normal range of motion. Neck supple.   Cardiovascular: Normal rate, regular rhythm, normal heart sounds and intact distal pulses.  Exam reveals no gallop and no friction rub.   No murmur heard. Pulmonary/Chest:  Minimal if any spontaneous breathing noted on initial exam.   Abdominal: Soft. Bowel sounds are normal. He exhibits distension (mild). There is no tenderness. There is no rebound and no guarding.  Moderate sized left inguinal hernia is soft to palpation.   Musculoskeletal: Normal range of motion. He exhibits no edema and no tenderness.  Neurological:  The patient is not alert. Not following commands. No purposeful movement noted on initial exam.   Skin: Skin is dry.  Skin is cool.     ED Course  INTUBATION Date/Time: 01/20/2013 1:34 PM Performed by: Purvis Sheffield, S Authorized by: Purvis Sheffield, S Consent: Verbal consent not obtained. written consent not obtained. The procedure was performed in an emergent situation. Indications: respiratory failure Intubation method: glidescope and direct. Patient status: unconscious Preoxygenation: BVM Pretreatment medications: none Tube size: 7.5 mm Tube type: cuffed Number of attempts: 4 Ventilation between attempts: BVM Cricoid pressure: yes Cords visualized: yes Post-procedure assessment: chest rise and ETCO2 monitor Breath sounds: equal Cuff inflated: yes Tube secured with: ETT holder Chest x-ray interpreted by radiologist and me. Chest x-ray findings: endotracheal tube in appropriate position Patient tolerance: Patient tolerated the procedure well with no immediate complications. Comments: 2 attempts were made with the glidescope unsuccessfully d/t inability to get the ETT in the appropriate position. Initially a good view of the vocal cords was seen, but the ETT was 8.0 and  I believe too large. On the second attempt with the glidescope the camera was obscured by saliva and secretions. A mac 3 was used for direct laryngoscopy but the pt had a flaccid  epiglottis. The pt was then intubated w/ a miller 3. BVM was utilized in between attempts.   IO LINE INSERTION Date/Time: 13-Feb-2013 1:39 PM Performed by: Purvis Sheffield, S Authorized by: Purvis Sheffield, S Consent: Verbal consent not obtained. written consent not obtained. The procedure was performed in an emergent situation. Patient identity confirmed: arm band and provided demographic data Time out: Immediately prior to procedure a "time out" was called to verify the correct patient, procedure, equipment, support staff and site/side marked as required. Indications: clinical deterioration, hemodynamic instability, fluid administration, medication administration and rapid vascular access Local anesthesia used: no Patient sedated: no Insertion site: left proximal humerus Insertion device: drill device Insertion: needle was inserted through the bony cortex Number of attempts: 1 Confirmation method: stability of the needle, easy infusion of fluids and aspiration of blood/marrow Patient tolerance: Patient tolerated the procedure well with no immediate complications.   (including critical care time) Labs Review Labs Reviewed  CBC WITH DIFFERENTIAL - Abnormal; Notable for the following:    WBC 10.8 (*)    RBC 3.68 (*)    Hemoglobin 11.2 (*)    HCT 35.3 (*)    Platelets 113 (*)    All other components within normal limits  COMPREHENSIVE METABOLIC PANEL - Abnormal; Notable for the following:    Potassium 5.3 (*)    CO2 9 (*)    Glucose, Bld 224 (*)    Creatinine, Ser 1.87 (*)    Total Protein 5.2 (*)    Albumin 2.4 (*)    AST 930 (*)    ALT 852 (*)    GFR calc non Af Amer 31 (*)    GFR calc Af Amer 36 (*)    All other components within normal limits  TROPONIN I - Abnormal; Notable for the following:    Troponin I 0.38 (*)    All other components within normal limits  PROTIME-INR - Abnormal; Notable for the following:    Prothrombin Time 19.8 (*)    INR 1.74 (*)    All  other components within normal limits  LACTIC ACID, PLASMA - Abnormal; Notable for the following:    Lactic Acid, Venous 12.6 (*)    All other components within normal limits  BLOOD GAS, ARTERIAL - Abnormal; Notable for the following:    pH, Arterial 6.885 (*)    pCO2 arterial 14.3 (*)    pO2, Arterial 138.0 (*)    Bicarbonate 2.6 (*)    All other components within normal limits  CK - Abnormal; Notable for the following:    Total CK 450 (*)    All other components within normal limits  BASIC METABOLIC PANEL - Abnormal; Notable for the following:    CO2 18 (*)    Glucose, Bld 203 (*)    Creatinine, Ser 1.97 (*)    Calcium 7.8 (*)    GFR calc non Af Amer 29 (*)    GFR calc Af Amer 33 (*)    All other components within normal limits  BLOOD GAS, ARTERIAL - Abnormal; Notable for the following:    pH, Arterial 6.974 (*)    pCO2 arterial 50.1 (*)    pO2, Arterial 157.0 (*)    Bicarbonate 11.1 (*)    Acid-base deficit 18.4 (*)    All other  components within normal limits  CBC - Abnormal; Notable for the following:    WBC 20.4 (*)    RBC 3.12 (*)    Hemoglobin 9.3 (*)    HCT 30.0 (*)    Platelets 129 (*)    All other components within normal limits  PROTIME-INR - Abnormal; Notable for the following:    Prothrombin Time 26.9 (*)    INR 2.59 (*)    All other components within normal limits  APTT - Abnormal; Notable for the following:    aPTT 98 (*)    All other components within normal limits  URINE CULTURE  CULTURE, BLOOD (ROUTINE X 2)  CULTURE, BLOOD (ROUTINE X 2)  CULTURE, BLOOD (ROUTINE X 2)  CULTURE, BLOOD (ROUTINE X 2)  URINE CULTURE  RESPIRATORY VIRUS PANEL  CULTURE, BLOOD (ROUTINE X 2)  CULTURE, BLOOD (ROUTINE X 2)  CULTURE, RESPIRATORY (NON-EXPECTORATED)  CULTURE, BLOOD (ROUTINE X 2)  TYPE AND SCREEN   Imaging Review Ct Head Wo Contrast  01/24/2013   CLINICAL DATA:  Loss of consciousness.  EXAM: CT HEAD WITHOUT CONTRAST  TECHNIQUE: Contiguous axial images were  obtained from the base of the skull through the vertex without intravenous contrast.  COMPARISON:  None.  FINDINGS: There is prominence of the sulci and ventricles compatible with atrophy. No midline shift, ventriculomegaly, mass effect, evidence of mass lesion, intracranial hemorrhage or evidence of cortically based acute infarction. Gray-white matter differentiation is within normal limits throughout the brain. The paranasal sinuses and mastoid air cells are clear. The skull is intact.  IMPRESSION: 1. No acute intracranial abnormalities. 2. Brain atrophy.   Electronically Signed   By: Signa Kell M.D.   On: 02/04/2013 21:26   Ct Angio Chest W/cm &/or Wo Cm  02/11/2013   CLINICAL DATA:  Status post CPR;  EXAM: CT ANGIOGRAPHY CHEST  CT ABDOMEN AND PELVIS WITH CONTRAST  TECHNIQUE: Multidetector CT imaging of the chest was performed using the standard protocol during bolus administration of intravenous contrast. Multiplanar CT image reconstructions including MIPs were obtained to evaluate the vascular anatomy. Multidetector CT imaging of the abdomen and pelvis was performed using the standard protocol during bolus administration of intravenous contrast.  CONTRAST:  125 mL of Omnipaque 350 IV contrast  COMPARISON:  None.  FINDINGS: CTA CHEST FINDINGS  There is pulmonary embolus within the pulmonary arteries to both lower lung lobes, and to the left upper lobe. A small amount of pulmonary embolus extends from the right main pulmonary artery, though the remainder of the visualized pulmonary embolus is lobar or segmental in nature. Associated right heart strain is noted, compatible with submassive pulmonary embolus.  There is no evidence of aortic dissection. No aneurysmal dilatation is noted along the thoracic aorta.  There is diffuse bilateral dependent opacification, which may reflect multifocal pneumonia or aspiration. Underlying diffuse hazy airspace opacification within the upper lung lobes may reflect flash  pulmonary edema. There is associated diffuse interstitial prominence. No definite pleural effusion is seen. A small residual right-sided pneumothorax is noted; the patient's right apical chest tube is grossly unremarkable in appearance, abutting consolidated lung.  The patient's endotracheal tube is seen ending 4 cm above the carina. No venous hemorrhage is seen. No pneumomediastinum is identified. Scattered mediastinal nodes remain normal in size. A few calcified mediastinal nodes are seen. No pericardial effusion is identified. The great vessels are grossly unremarkable in appearance. No axillary lymphadenopathy is seen. The visualized portions of the thyroid gland are unremarkable in appearance.  A large amount of soft tissue air is noted along the right side of the chest, tracking about the right shoulder, into the right side of the neck, to the left side of the chest and inferiorly to the level of the right iliac wing.  There are minimally displaced fractures involving the right third through fifth anterior ribs.  CT ABDOMEN and PELVIS FINDINGS  No free air or free fluid is seen within the abdomen or pelvis. There is no evidence of solid or hollow organ injury.  There is no evidence of aortic dissection. The celiac trunk, superior mesenteric artery, right renal artery and inferior mesenteric artery remain patent. No significant calcific atherosclerotic disease is seen, except along the left internal iliac artery, where there is likely moderate luminal narrowing due to plaque.  The heterogeneous enhancement of the liver is thought to reflect the phase of contrast enhancement. The spleen is not well assessed due to the phase of contrast enhancement; vaguely decreased attenuation within the spleen is thought reflect unenhanced spleen rather than infarct. The gallbladder is within normal limits. The pancreas is diminutive and grossly unremarkable in appearance. The adrenal glands are grossly unremarkable, though  difficult to fully assess.  The left kidney is absent. A large 6.8 cm cyst is noted at the upper pole of the right kidney. There is diffuse renal scarring and mild atrophy, with additional scattered smaller right renal cysts. There is no evidence of hydronephrosis. No renal or ureteral stones are seen. Mild nonspecific perinephric stranding is noted.  No free fluid is identified. The small bowel is unremarkable in appearance. The stomach is within normal limits. No acute vascular abnormalities are seen.  The appendix is prominent, measuring up to 9 mm, but no significant associated soft tissue inflammation is seen, and this is thought to remain within normal limits. The colon is unremarkable in appearance, aside from extension of a segment of proximal sigmoid colon into a moderate to large left inguinal hernia. A loop of ileum is also seen extending into this inguinal hernia.  The bladder is decompressed, with a Foley catheter in place. Scattered brachytherapy seeds are noted about the prostate; the prostate remains normal in size. No inguinal lymphadenopathy is seen.  No acute osseous abnormalities are identified.  Review of the MIP images confirms the above findings.  IMPRESSION: 1. Pulmonary embolus within the pulmonary arteries to both lower lung lobes, and to the left upper lobe. Associated right heart strain noted, compatible with submassive pulmonary embolus. 2. No evidence of aortic dissection or aneurysmal dilatation along the thoracic or abdominal aorta. 3. Diffuse bilateral dependent opacification within the lungs, which may reflect multifocal pneumonia or aspiration. Additional diffuse hazy airspace opacification within the upper lung lobes may reflect flash pulmonary edema, with underlying interstitial prominence. 4. Residual small right pneumothorax; right apical chest tube is grossly unremarkable in appearance, abutting consolidated lung. 5. Minimally displaced fractures involving the right third  through fifth anterior ribs. 6. Large amount of soft tissue air noted along the right side of the chest, tracking about the right shoulder and extending into the right side of the neck, and also extending to the left side of the chest and inferiorly to the level of the right iliac wing. 7. Large right renal cyst; diffuse right renal scarring and mild atrophy, with scattered small right renal cysts. Left kidney is absent. 8. Moderate to large left inguinal hernia, containing a segment of proximal sigmoid colon and a loop of ileum. No evidence of obstruction  or strangulation at this time.  Critical Value/emergent results were called by telephone at the time of interpretation on 02/01/2013 at 9:31 PM to Dr. Romeo Apple, who verbally acknowledged these results.   Electronically Signed   By: Roanna Raider M.D.   On: 02/05/2013 21:58   Ct Abdomen Pelvis W Contrast  01/20/2013   CLINICAL DATA:  Status post CPR;  EXAM: CT ANGIOGRAPHY CHEST  CT ABDOMEN AND PELVIS WITH CONTRAST  TECHNIQUE: Multidetector CT imaging of the chest was performed using the standard protocol during bolus administration of intravenous contrast. Multiplanar CT image reconstructions including MIPs were obtained to evaluate the vascular anatomy. Multidetector CT imaging of the abdomen and pelvis was performed using the standard protocol during bolus administration of intravenous contrast.  CONTRAST:  125 mL of Omnipaque 350 IV contrast  COMPARISON:  None.  FINDINGS: CTA CHEST FINDINGS  There is pulmonary embolus within the pulmonary arteries to both lower lung lobes, and to the left upper lobe. A small amount of pulmonary embolus extends from the right main pulmonary artery, though the remainder of the visualized pulmonary embolus is lobar or segmental in nature. Associated right heart strain is noted, compatible with submassive pulmonary embolus.  There is no evidence of aortic dissection. No aneurysmal dilatation is noted along the thoracic aorta.   There is diffuse bilateral dependent opacification, which may reflect multifocal pneumonia or aspiration. Underlying diffuse hazy airspace opacification within the upper lung lobes may reflect flash pulmonary edema. There is associated diffuse interstitial prominence. No definite pleural effusion is seen. A small residual right-sided pneumothorax is noted; the patient's right apical chest tube is grossly unremarkable in appearance, abutting consolidated lung.  The patient's endotracheal tube is seen ending 4 cm above the carina. No venous hemorrhage is seen. No pneumomediastinum is identified. Scattered mediastinal nodes remain normal in size. A few calcified mediastinal nodes are seen. No pericardial effusion is identified. The great vessels are grossly unremarkable in appearance. No axillary lymphadenopathy is seen. The visualized portions of the thyroid gland are unremarkable in appearance.  A large amount of soft tissue air is noted along the right side of the chest, tracking about the right shoulder, into the right side of the neck, to the left side of the chest and inferiorly to the level of the right iliac wing.  There are minimally displaced fractures involving the right third through fifth anterior ribs.  CT ABDOMEN and PELVIS FINDINGS  No free air or free fluid is seen within the abdomen or pelvis. There is no evidence of solid or hollow organ injury.  There is no evidence of aortic dissection. The celiac trunk, superior mesenteric artery, right renal artery and inferior mesenteric artery remain patent. No significant calcific atherosclerotic disease is seen, except along the left internal iliac artery, where there is likely moderate luminal narrowing due to plaque.  The heterogeneous enhancement of the liver is thought to reflect the phase of contrast enhancement. The spleen is not well assessed due to the phase of contrast enhancement; vaguely decreased attenuation within the spleen is thought reflect  unenhanced spleen rather than infarct. The gallbladder is within normal limits. The pancreas is diminutive and grossly unremarkable in appearance. The adrenal glands are grossly unremarkable, though difficult to fully assess.  The left kidney is absent. A large 6.8 cm cyst is noted at the upper pole of the right kidney. There is diffuse renal scarring and mild atrophy, with additional scattered smaller right renal cysts. There is no evidence of hydronephrosis.  No renal or ureteral stones are seen. Mild nonspecific perinephric stranding is noted.  No free fluid is identified. The small bowel is unremarkable in appearance. The stomach is within normal limits. No acute vascular abnormalities are seen.  The appendix is prominent, measuring up to 9 mm, but no significant associated soft tissue inflammation is seen, and this is thought to remain within normal limits. The colon is unremarkable in appearance, aside from extension of a segment of proximal sigmoid colon into a moderate to large left inguinal hernia. A loop of ileum is also seen extending into this inguinal hernia.  The bladder is decompressed, with a Foley catheter in place. Scattered brachytherapy seeds are noted about the prostate; the prostate remains normal in size. No inguinal lymphadenopathy is seen.  No acute osseous abnormalities are identified.  Review of the MIP images confirms the above findings.  IMPRESSION: 1. Pulmonary embolus within the pulmonary arteries to both lower lung lobes, and to the left upper lobe. Associated right heart strain noted, compatible with submassive pulmonary embolus. 2. No evidence of aortic dissection or aneurysmal dilatation along the thoracic or abdominal aorta. 3. Diffuse bilateral dependent opacification within the lungs, which may reflect multifocal pneumonia or aspiration. Additional diffuse hazy airspace opacification within the upper lung lobes may reflect flash pulmonary edema, with underlying interstitial  prominence. 4. Residual small right pneumothorax; right apical chest tube is grossly unremarkable in appearance, abutting consolidated lung. 5. Minimally displaced fractures involving the right third through fifth anterior ribs. 6. Large amount of soft tissue air noted along the right side of the chest, tracking about the right shoulder and extending into the right side of the neck, and also extending to the left side of the chest and inferiorly to the level of the right iliac wing. 7. Large right renal cyst; diffuse right renal scarring and mild atrophy, with scattered small right renal cysts. Left kidney is absent. 8. Moderate to large left inguinal hernia, containing a segment of proximal sigmoid colon and a loop of ileum. No evidence of obstruction or strangulation at this time.  Critical Value/emergent results were called by telephone at the time of interpretation on 01/25/2013 at 9:31 PM to Dr. Romeo Apple, who verbally acknowledged these results.   Electronically Signed   By: Roanna Raider M.D.   On: 01/22/2013 21:58   Dg Chest Port 1 View  01/21/2013   CLINICAL DATA:  Insertion and then removal of a chest tube  EXAM: PORTABLE CHEST - 1 VIEW  COMPARISON:  None.  FINDINGS: The lungs are hyperinflated. There is a tiny pneumothorax visible in the apex on the right. This amounts to 10% or less of the lung volume. There is a small caliber chest tube tip projecting over the posterior lateral aspect of the right 3rd rib. There is a large amount of subcutaneous emphysema on the right and a smaller amount on the left. There is no alveolar infiltrate but the interstitial markings are confluent in both upper lobes. The cardiopericardial silhouette is not enlarged. The pulmonary vascularity is not engorged. The endotracheal tube tip lies approximately 5.6 cm above the crotch of the carina. A right internal jugular venous catheter tip lies in the region of the junction of the proximal and mid SVC.  IMPRESSION: 1. There  is a small right apical pneumothorax. There is no mediastinal shift. The tip of the small caliber chest tube overlies the posterior lateral aspect of the right 3rd rib. 2. The endotracheal tube tip lies 5.6 cm  above the crotch of the carina. 3. Hazy increased interstitial density is present in the upper lobes of both lungs. This is accentuated by the presence of the subcutaneous emphysema. 4. There is no definite evidence of pulmonary vascular congestion or pleural effusion. .   Electronically Signed   By: David  [place]   On: 01/23/2013 20:59    EKG Interpretation    Date/Time:  Friday February 06 2013 19:07:37 EST Ventricular Rate:  124 PR Interval:  129 QRS Duration: 115 QT Interval:  356 QTC Calculation: 511 R Axis:   -86 Text Interpretation:  Sinus tachycardia Consider right atrial enlargement Incomplete right bundle branch block Inferior infarct, old Confirmed by Ethelwyn Gilbertson  MD, Valeda Corzine ([id number]) on 01/12/2013 11:19:32 PM           Cardiopulmonary Resuscitation (CPR) Procedure Note Directed/Performed by: Purvis Sheffield, S I personally directed ancillary staff and/or performed CPR in an effort to regain return of spontaneous circulation and to maintain cardiac, neuro and systemic perfusion.   CRITICAL CARE Performed by: Purvis Sheffield, S Total critical care time: 1hour and 20 minutes Critical care time was exclusive of separately billable procedures and treating other patients. Critical care was necessary to treat or prevent imminent or life-threatening deterioration. Critical care was time spent personally by me on the following activities: development of treatment plan with patient and/or surrogate as well as nursing, discussions with consultants, evaluation of patient's response to treatment, examination of patient, obtaining history from patient or surrogate, ordering and performing treatments and interventions, ordering and review of laboratory studies, ordering and review of  radiographic studies, pulse oximetry and re-evaluation of patient's condition.  MDM   1. Respiratory failure   2. Cardiac arrest   3. PE (pulmonary embolism)    7:10 PM 79 y.o. male who pw report of back pain. EMS states pt was found down in a parking lot c/o back pain upon their arrival. They state he had decreasing level of consciousness en route. When I entered the exam room he had minimal if any spont breathing, but had a stable BP and a pulse. Level 5 caveat due to acuity of condition. We utilized a BVM and transported him immediately to a resuscitation room.   The patient became bradycardic during intubation attempts. Atropine was given, but the bradycardia worsened and a pulse was not felt. CPR was initiated. The pt was ultimately intubated. His initial rhythm once checked was found to be PEA. ROSC occurred after cpr for approximately 10 minutes and I consulted CC during this time. The pt then developed worsening bradycardia leading to loss of pulses. CPR was again initiated.  Rhythms during this episode of cpr varied from asystole, to PEA, and fine v fib which he was defibrillated for.   During cpr the CC physician arrived. I decompressed his right chest w/ a 14 angiocath to the 2nd intercostal space d/t absence of sliding lung seen on this side on bedside US. Bedside cardiac echo showed hyperdynamic ventricles w no evidence of pericardial effusion. A right sided chest tube, central line, and arterial line were placed by the CC physician. The pt again had ROSC. He was taken to the CT scanner and found to have a PE. I notified CC of this via telephone. The pt was taken to [facility] for admission to the ICU.     Junius Argyle, MD 01/18/2013 1351

## 2013-02-06 NOTE — Progress Notes (Signed)
[facility]Link Physician-Brief Progress Note Patient Name: Steve Boone DOB: 1923/03/28 MRN: [id number]  Date of Service  Mar 05, 2013   HPI/Events of Note   Discussed with POA - rislk of thrombolytic Goals of care   eICU Interventions  No transfusion -since he is Jehovah No tpa -high risk of bleed No CPR/ no cardioversion    Intervention Category Major Interventions: End of life / care limitation discussion;Shock - evaluation and management  Arrington Yohe V. 2013/03/05, 11:11 PM

## 2013-02-06 NOTE — Progress Notes (Signed)
ANTICOAGULATION CONSULT NOTE - Initial Consult  Pharmacy Consult for Heparin Indication: suspected PE  No Known Allergies  Patient Measurements: Height: 5' 8.11" (173 cm) Weight: 143 lb 11.8 oz (65.2 kg) IBW/kg (Calculated) : 68.65 Heparin Dosing Weight: 65.2 kg  Vital Signs: BP: 97/60 mmHg (12/26 2030) Pulse Rate: 125 (12/26 2030)  Labs:  Recent Labs  01/27/2013 2035 02/05/2013 2247  HGB 11.2* 9.3*  HCT 35.3* 30.0*  PLT 113* 129*  LABPROT 19.8*  --   INR 1.74*  --   CREATININE 1.87*  --   TROPONINI 0.38*  --     Estimated Creatinine Clearance: 25.7 ml/min (by C-G formula based on Cr of 1.87).   Medical History: Past Medical History  Diagnosis Date  . Asthma   . Depression   . Hypertension   . Malignant neoplasm of kidney, except pelvis   . Other specified glaucoma   . Personal history of malignant neoplasm of prostate     Medications:  . fentaNYL infusion INTRAVENOUS    . heparin    . norepinephrine (LEVOPHED) Adult infusion 40 mcg/min (01/20/2013 2311)  .  sodium bicarbonate  infusion 1000 mL 75 mL/hr at 01/19/2013 2302  . vasopressin (PITRESSIN) infusion - *FOR SHOCK*       Assessment: 79 yo male admitted as cardiac arrest.  Pharmacy asked to begin IV heparin for suspected PE.  Initially MD ordered TPA as well, but then order was cancelled before dose was given.  Patient also has significant groin and chest hematoma.  Spoke with Dr. Vassie Loll - okay to bolus heparin for PE.  Patient is also Jehovah's witness.  Goal of Therapy:  Heparin level 0.3-0.7 units/ml Monitor platelets by anticoagulation protocol: Yes   Plan:  1. Start IV heparin with bolus of 4000 units x 1. 2. Then start IV heparin infusion at 1000 units/hr. 3. Check heparin level 8 hrs after infusion begins. 4. Daily heparin level and CBC.  Tad Moore, BCPS  Clinical Pharmacist Pager [phone]  01/14/2013 11:32 PM

## 2013-02-06 NOTE — ED Notes (Addendum)
CRITICAL VALUE ALERT  Critical value received:  Troponin 0.38; CO2 9 Date of notification:  February 25, 2013 Time of notification:  2115 Critical value read back:yes Nurse who received alert:  Kai Levins, RN Primary RN notified: Patient has already left [facility] and in route to [facility]. Notified Denny Peon, RN -[facility] [place] [facility] [employer] via phone

## 2013-02-06 NOTE — Progress Notes (Signed)
With patient since beginning of Shift. Patient began to Code Chest compressions done and patient bagged with 100% O2 throughout Code. Flynt Breeze blood began to come up the ET tube and out of the Ambu Bag. Attached bag to ballard from vent and suctioned patient for copious amounts of red bloody secretions. Tolerated well. Continued to code throughout. Patient shocked once for fine v fib and compressions done post and ROSC achieved. BAG done and unable to be put into computer because patient began to code while running ABG. Results read to Dr. Ala Dach and began bagging patient again throughout the code. MD placed chest tube and HR returned just prior to placement. Patient placed back on vent and tolerated settings well. MD increased RR from 18 to 26 and post Code and placement on Art line and Central line by MD, decreased rate to 24 and decreased VT to 400 post Xray results. Patient transported to CT without issues and taken from CT to [facility] via [facility].

## 2013-02-06 NOTE — Procedures (Signed)
Chest Tube Insertion Procedure Note  Indications:  Clinically significant Pneumothorax on ultrasound (negative lung sliding) with very high peak airway pressures following cardiac arrest  Pre-operative Diagnosis: Pneumothorax  Post-operative Diagnosis: Pneumothorax  Procedure Details  Informed consent was obtained for the procedure, including sedation.  Risks of lung perforation, hemorrhage, arrhythmia, and adverse drug reaction were discussed.   After sterile skin prep, using standard technique, a 20 French tube was placed in the right lateral 3rd and 4th rib space.  Findings: Rush of air  Estimated Blood Loss:  Minimal         Specimens:  None              Complications:  None; patient tolerated the procedure well with improvements in blood pressure following chest tube placement         Disposition: ICU - intubated and critically ill.         Condition: unstable  Attending Attestation: I was present and scrubbed for the entire procedure.  Leontine Locket, MD CCM Fellow [phone]

## 2013-02-06 NOTE — Procedures (Signed)
Central Venous Catheter Insertion Procedure Note YAHYA BOLDMAN [id number] December 30, 1923  Procedure: Insertion of Central Venous Catheter Indications: Drug and/or fluid administration and Frequent blood sampling  Procedure Details Consent: Unable to obtain consent because of emergent medical necessity. Time Out: Following attempt by ED MD, Verified patient identification, verified procedure, site/side was marked, verified correct patient position, special equipment/implants available, medications/allergies/relevent history reviewed, required imaging and test results available.  Performed  Maximum sterile technique was used including antiseptics, cap, gloves, gown, hand hygiene, mask and sheet. Skin prep: Chlorhexidine; local anesthetic administered A antimicrobial bonded/coated triple lumen catheter was placed in the left internal jugular vein using the Seldinger technique.  Evaluation Blood flow good Complications: No apparent complications Patient did tolerate procedure well. Chest X-ray ordered to verify placement.  CXR: in place  Levert Feinstein Feb 19, 2013, 9:13 PM

## 2013-02-06 NOTE — Procedures (Signed)
PATIENT NAME: CUSTER PIMENTA MEDICAL RECORD NUMBER: [id number] Birthday: [birth year]  Age: 79 y.o. Admit Date: 2013/03/07  Provider: Ala Dach    Indication: PEA arrest  Technical Description:   CPR performance duration: 9   Was defibrillation or cardioversion used ? no  Was external pacer placed ? no  Was patient intubated pre/post CPR ? Intubated prior  Was transvenous pacer placed ? no  Medications Administered Include      Yes/no Amiodarone   Atropin   Calcium 1 amp  Epinephrine 2 amps  Lidocaine   Magnesium   Norepinephrine Gtt initated  Phenylephrine   Sodium bicarbonate   Vasopression    Evaluation  Final Status - Was patient successfully resuscitated ? yes  If successfully resuscitated - what is current rhythm ? Sinus tach If successfully resuscitated - what is current hemodynamic status ? Requiring pressors  Miscellaneous Information Patient underwent CPR with ROSC following needle decompression of right sided tension pnx.  Chest tube subsequently placed with improvement of hemodynamics.  Levert Feinstein 2015/01/249:08 PM

## 2013-02-07 ENCOUNTER — Ambulatory Visit (HOSPITAL_COMMUNITY): Payer: Medicare Other

## 2013-02-07 DIAGNOSIS — J9690 Respiratory failure, unspecified, unspecified whether with hypoxia or hypercapnia: Secondary | ICD-10-CM | POA: Diagnosis present

## 2013-02-07 DIAGNOSIS — I2699 Other pulmonary embolism without acute cor pulmonale: Secondary | ICD-10-CM | POA: Diagnosis present

## 2013-02-07 MED FILL — Medication: Qty: 3 | Status: AC

## 2013-02-11 MED FILL — Epinephrine HCl Inj 1 MG/ML: INTRAMUSCULAR | Qty: 2 | Status: AC

## 2013-02-12 NOTE — Progress Notes (Addendum)
Patient arrived from [place] with vasopressin, fentanyl and levophed running.  Patient was transferred into room 2S07, was tachycardic and hypotensive.  The fentanyl was turned off due to hypotension. [facility] notified, orders given and carried out.  Patient was made a Partial Code.  Sodium bicarb was started.  Family arrived.  Patient cardiac arrested, MD made aware, instructed to stop all treatment.  All medications were stopped, ventilator turned off. Time of death was 23-Feb-2013 at 0001.  At that time patient asystolic, no respirations, confirmed by 2 RNs.

## 2013-02-12 DEATH — deceased

## 2013-02-13 LAB — CULTURE, BLOOD (ROUTINE X 2): Culture: NO GROWTH

## 2013-02-27 NOTE — Discharge Summary (Signed)
NAMERODOLPHE, EDMONSTON NO.:  [id number]  MEDICAL RECORD NO.:  [id number]  LOCATION:  2S07C                        FACILITY:  [facility]  PHYSICIAN:  Raylene Miyamoto, MD DATE OF BIRTH:  01-Jan-1924  DATE OF ADMISSION:  01/13/2013 DATE OF DISCHARGE:  02/01/2013                              DISCHARGE SUMMARY   DEATH SUMMARY  This is an 80 year old man presented after being found in a parking lot with altered mental status complaining of back pain, however, difficult to characterize otherwise.  The patient was intubated in the ED secondary to __________ hypotension.  There is no family apparently to assist with additional history.  Essentially, patient's significant events included echocardiogram at bedside done by the bedside intensivist, Dr. Marijean Bravo which showed RV failure.  The patient's chest CT revealed submassive PE with right heart strain.  The patient had a left IJ placed, left femoral A-line, endotracheal tube.  There was a concern for tension pneumothoraces at [facility] for which the patient received a chest tube, and the patient was eventually transferred to [facility].  It was noted the patient was a Jehovah's Witness and no blood products were provided.  There was massive __________ hemorrhage coming from the patient's endotracheal tube.  The patient was attempted to have resuscitation, pressors, central line access.  It was decided with conversations with Dr. Elsworth Soho and the medical power of attorney.  Eventually that the goals of care were no transfusion, no tPA since high risk of bleed, unable to transfuse, no CPR, no cardioversion.  The patient expired.  FINAL DIAGNOSES UPON DEATH: 1. Likely massive/submassive __________ with right heart failure. 2. Acute respiratory failure. 3. Refractory shock likely obstructive in nature to rule out septic     shock. 4. Multiorgan dysfunction syndrome. 5. Status post cardiac arrest upon  admission to the [facility]     Emergency Room. 6. Rule out pneumothorax secondary to cardiopulmonary resuscitation     status post chest tube.     Raylene Miyamoto, MD     DJF/MEDQ  D:  02/26/2013  T:  02/27/2013  Job:  [id number]

## 2013-03-02 NOTE — Discharge Summary (Signed)
NAME:  Steve Boone, Steve Boone                  ACCOUNT NO.:  MEDICAL RECORD NO.:  [id number]  LOCATION:                                 FACILITY:  PHYSICIAN:  Raylene Miyamoto, MD DATE OF BIRTH:  [birth year]  DATE OF ADMISSION: DATE OF DISCHARGE:                              DISCHARGE SUMMARY   DICTATION ENDED AT THIS POINT.     Raylene Miyamoto, MD     DJF/MEDQ  D:  02/26/2013  T:  02/27/2013  Job:  [id number]

## 2013-03-27 LAB — BLOOD GAS, ARTERIAL
Bicarbonate: 2.6 mEq/L — ABNORMAL LOW (ref 20.0–24.0)
Drawn by: 24513
FIO2: 100 %
O2 Saturation: 97 %
Patient temperature: 98.6
TCO2: 2.7 mmol/L (ref 0–100)
pCO2 arterial: 14.3 mmHg — CL (ref 35.0–45.0)
pH, Arterial: 6.885 — CL (ref 7.350–7.450)
pO2, Arterial: 138 mmHg — ABNORMAL HIGH (ref 80.0–100.0)

## 2015-03-20 IMAGING — CR DG CHEST 1V PORT
1 series · 1 of 1 positions shown · non-contrast
Comparison: None.

CLINICAL DATA: Insertion and then removal of a chest tube

EXAM:
PORTABLE CHEST - 1 VIEW

[AP]
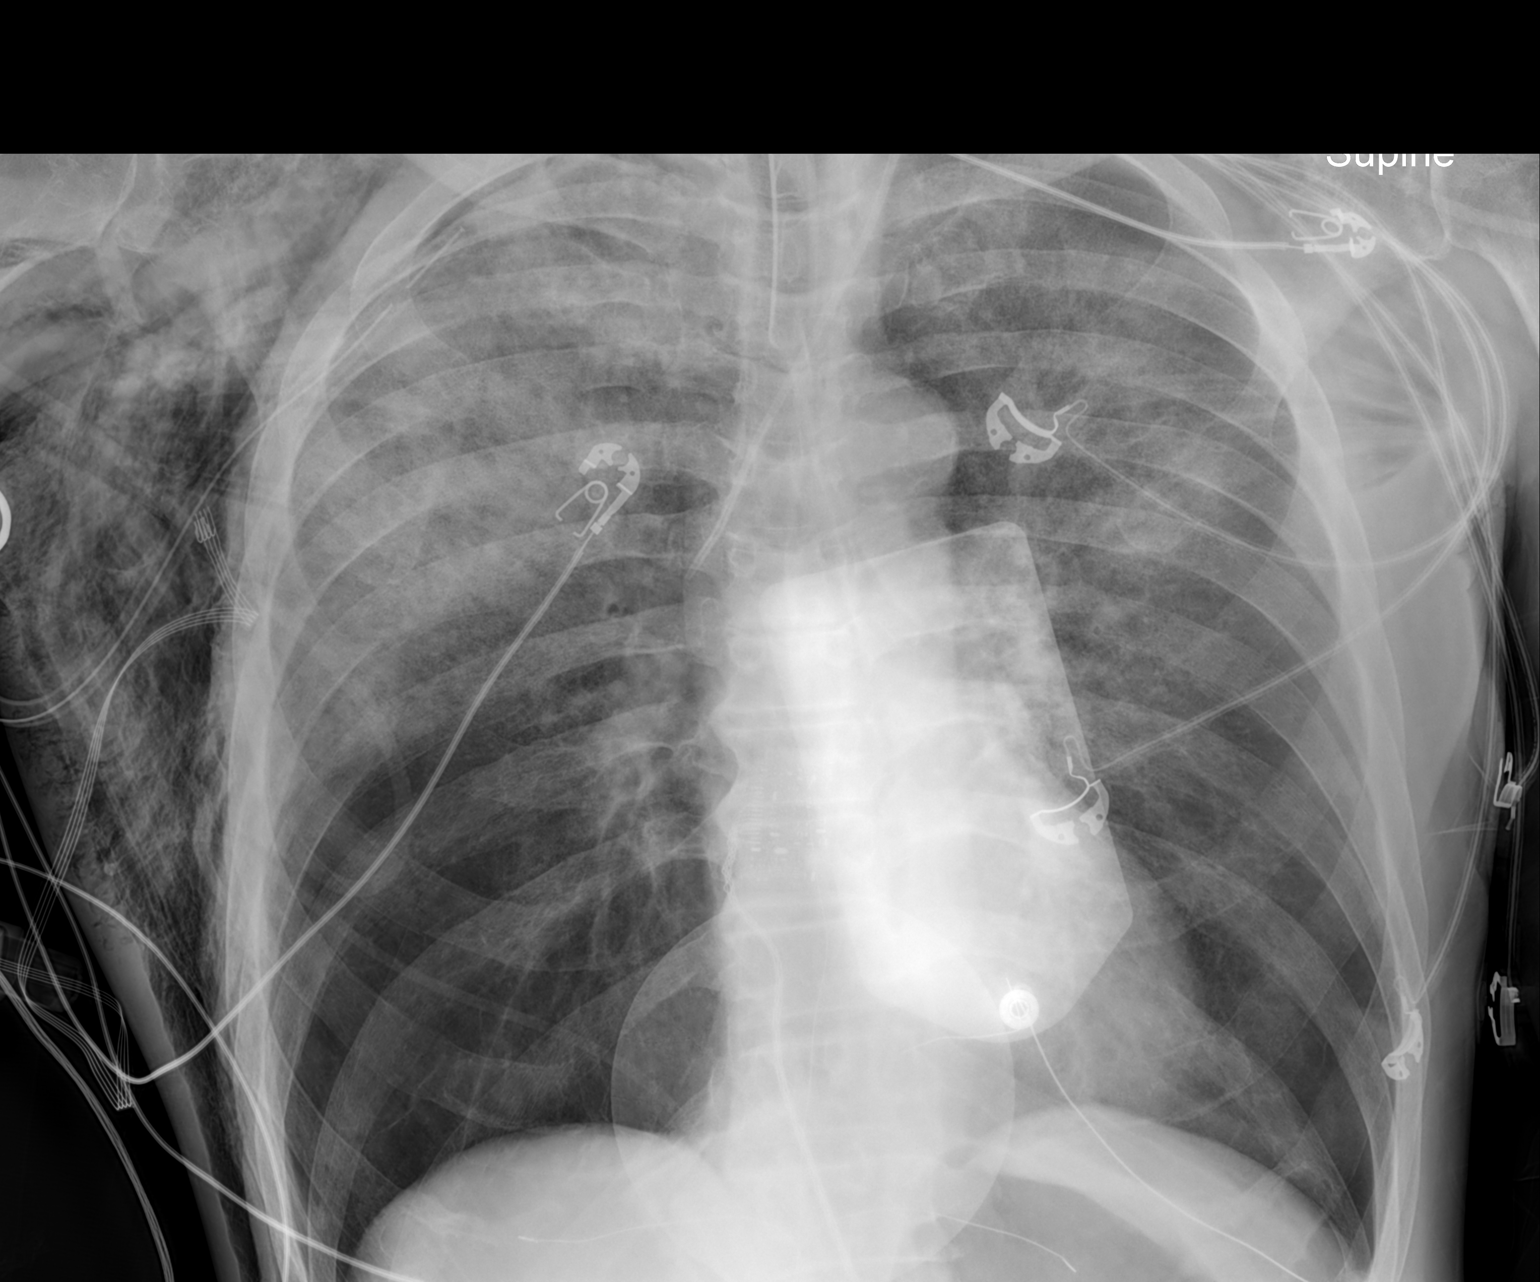

[1 of 1 positions shown; findings below may reference images not displayed]

FINDINGS: The lungs are hyperinflated. There is a tiny pneumothorax visible in
the apex on the right. This amounts to 10% or less of the lung
volume. There is a small caliber chest tube tip projecting over the
posterior lateral aspect of the right 3rd rib. There is a large
amount of subcutaneous emphysema on the right and a smaller amount
on the left. There is no alveolar infiltrate but the interstitial
markings are confluent in both upper lobes. The cardiopericardial
silhouette is not enlarged. The pulmonary vascularity is not
engorged. The endotracheal tube tip lies approximately 5.6 cm above
the crotch of the carina. A right internal jugular venous catheter
tip lies in the region of the junction of the proximal and mid SVC.
IMPRESSION: 1. There is a small right apical pneumothorax. There is no
mediastinal shift. The tip of the small caliber chest tube overlies
the posterior lateral aspect of the right 3rd rib.
2. The endotracheal tube tip lies 5.6 cm above the crotch of the
carina.
3. Hazy increased interstitial density is present in the upper lobes
of both lungs. This is accentuated by the presence of the
subcutaneous emphysema.
4. There is no definite evidence of pulmonary vascular congestion or
pleural effusion.
.
